# Patient Record
Sex: Male | Born: 1940 | Hispanic: Yes | Marital: Married | Smoking: Former smoker
Health system: Southern US, Community
[De-identification: ages and names within clinical notes are randomized; demographics above are authoritative.]

## PROBLEM LIST (undated history)

## (undated) DIAGNOSIS — I10 Essential (primary) hypertension: Secondary | ICD-10-CM

## (undated) DIAGNOSIS — E119 Type 2 diabetes mellitus without complications: Secondary | ICD-10-CM

## (undated) DIAGNOSIS — I2510 Atherosclerotic heart disease of native coronary artery without angina pectoris: Secondary | ICD-10-CM

## (undated) DIAGNOSIS — N189 Chronic kidney disease, unspecified: Secondary | ICD-10-CM

## (undated) DIAGNOSIS — M109 Gout, unspecified: Secondary | ICD-10-CM

## (undated) DIAGNOSIS — Z87442 Personal history of urinary calculi: Secondary | ICD-10-CM

## (undated) DIAGNOSIS — I509 Heart failure, unspecified: Secondary | ICD-10-CM

## (undated) DIAGNOSIS — I214 Non-ST elevation (NSTEMI) myocardial infarction: Secondary | ICD-10-CM

## (undated) DIAGNOSIS — C801 Malignant (primary) neoplasm, unspecified: Secondary | ICD-10-CM

## (undated) HISTORY — PX: CHOLECYSTECTOMY: SHX55

## (undated) HISTORY — PX: CATARACT EXTRACTION: SUR2

## (undated) HISTORY — PX: COLON SURGERY: SHX602

---

## 2015-08-03 DIAGNOSIS — I214 Non-ST elevation (NSTEMI) myocardial infarction: Secondary | ICD-10-CM

## 2015-08-03 DIAGNOSIS — I2510 Atherosclerotic heart disease of native coronary artery without angina pectoris: Secondary | ICD-10-CM

## 2015-08-03 HISTORY — DX: Atherosclerotic heart disease of native coronary artery without angina pectoris: I25.10

## 2015-08-03 HISTORY — DX: Non-ST elevation (NSTEMI) myocardial infarction: I21.4

## 2015-08-15 ENCOUNTER — Inpatient Hospital Stay (HOSPITAL_COMMUNITY)
Admission: EM | Admit: 2015-08-15 | Discharge: 2015-08-22 | DRG: 281 | Disposition: A | Discharge status: Home / Self Care | Payer: Medicare (Managed Care) | Attending: Internal Medicine | Admitting: Internal Medicine

## 2015-08-15 ENCOUNTER — Emergency Department (HOSPITAL_COMMUNITY): Payer: Medicare (Managed Care)

## 2015-08-15 ENCOUNTER — Encounter (HOSPITAL_COMMUNITY): Payer: Self-pay | Admitting: *Deleted

## 2015-08-15 DIAGNOSIS — I252 Old myocardial infarction: Secondary | ICD-10-CM

## 2015-08-15 DIAGNOSIS — N183 Chronic kidney disease, stage 3 unspecified: Secondary | ICD-10-CM | POA: Diagnosis present

## 2015-08-15 DIAGNOSIS — E1122 Type 2 diabetes mellitus with diabetic chronic kidney disease: Secondary | ICD-10-CM | POA: Diagnosis present

## 2015-08-15 DIAGNOSIS — J9801 Acute bronchospasm: Secondary | ICD-10-CM

## 2015-08-15 DIAGNOSIS — Z8546 Personal history of malignant neoplasm of prostate: Secondary | ICD-10-CM

## 2015-08-15 DIAGNOSIS — E785 Hyperlipidemia, unspecified: Secondary | ICD-10-CM | POA: Diagnosis present

## 2015-08-15 DIAGNOSIS — D649 Anemia, unspecified: Secondary | ICD-10-CM

## 2015-08-15 DIAGNOSIS — I5032 Chronic diastolic (congestive) heart failure: Secondary | ICD-10-CM | POA: Diagnosis present

## 2015-08-15 DIAGNOSIS — Z882 Allergy status to sulfonamides status: Secondary | ICD-10-CM

## 2015-08-15 DIAGNOSIS — E119 Type 2 diabetes mellitus without complications: Secondary | ICD-10-CM

## 2015-08-15 DIAGNOSIS — R0609 Other forms of dyspnea: Secondary | ICD-10-CM

## 2015-08-15 DIAGNOSIS — R0602 Shortness of breath: Secondary | ICD-10-CM | POA: Diagnosis not present

## 2015-08-15 DIAGNOSIS — J449 Chronic obstructive pulmonary disease, unspecified: Secondary | ICD-10-CM | POA: Diagnosis present

## 2015-08-15 DIAGNOSIS — R7989 Other specified abnormal findings of blood chemistry: Secondary | ICD-10-CM

## 2015-08-15 DIAGNOSIS — Z87891 Personal history of nicotine dependence: Secondary | ICD-10-CM

## 2015-08-15 DIAGNOSIS — R0603 Acute respiratory distress: Secondary | ICD-10-CM | POA: Diagnosis present

## 2015-08-15 DIAGNOSIS — I130 Hypertensive heart and chronic kidney disease with heart failure and stage 1 through stage 4 chronic kidney disease, or unspecified chronic kidney disease: Secondary | ICD-10-CM | POA: Diagnosis present

## 2015-08-15 DIAGNOSIS — I10 Essential (primary) hypertension: Secondary | ICD-10-CM | POA: Diagnosis present

## 2015-08-15 DIAGNOSIS — Z79899 Other long term (current) drug therapy: Secondary | ICD-10-CM

## 2015-08-15 DIAGNOSIS — M25512 Pain in left shoulder: Secondary | ICD-10-CM

## 2015-08-15 DIAGNOSIS — Z886 Allergy status to analgesic agent status: Secondary | ICD-10-CM

## 2015-08-15 DIAGNOSIS — Z794 Long term (current) use of insulin: Secondary | ICD-10-CM

## 2015-08-15 DIAGNOSIS — E11649 Type 2 diabetes mellitus with hypoglycemia without coma: Secondary | ICD-10-CM | POA: Diagnosis present

## 2015-08-15 DIAGNOSIS — I2510 Atherosclerotic heart disease of native coronary artery without angina pectoris: Secondary | ICD-10-CM

## 2015-08-15 DIAGNOSIS — D638 Anemia in other chronic diseases classified elsewhere: Secondary | ICD-10-CM | POA: Diagnosis present

## 2015-08-15 DIAGNOSIS — E1129 Type 2 diabetes mellitus with other diabetic kidney complication: Secondary | ICD-10-CM

## 2015-08-15 DIAGNOSIS — I214 Non-ST elevation (NSTEMI) myocardial infarction: Secondary | ICD-10-CM | POA: Diagnosis not present

## 2015-08-15 DIAGNOSIS — N189 Chronic kidney disease, unspecified: Secondary | ICD-10-CM | POA: Diagnosis not present

## 2015-08-15 DIAGNOSIS — Z7982 Long term (current) use of aspirin: Secondary | ICD-10-CM

## 2015-08-15 DIAGNOSIS — R778 Other specified abnormalities of plasma proteins: Secondary | ICD-10-CM | POA: Insufficient documentation

## 2015-08-15 DIAGNOSIS — I25118 Atherosclerotic heart disease of native coronary artery with other forms of angina pectoris: Secondary | ICD-10-CM | POA: Diagnosis present

## 2015-08-15 HISTORY — DX: Malignant (primary) neoplasm, unspecified: C80.1

## 2015-08-15 HISTORY — DX: Essential (primary) hypertension: I10

## 2015-08-15 HISTORY — DX: Type 2 diabetes mellitus without complications: E11.9

## 2015-08-15 LAB — CBC WITH DIFFERENTIAL/PLATELET
BASOS ABS: 0 10*3/uL (ref 0.0–0.1)
BASOS PCT: 1 %
Eosinophils Absolute: 0.7 10*3/uL (ref 0.0–0.7)
Eosinophils Relative: 11 %
HEMATOCRIT: 35.8 % — AB (ref 39.0–52.0)
HEMOGLOBIN: 11.9 g/dL — AB (ref 13.0–17.0)
LYMPHS PCT: 28 %
Lymphs Abs: 1.8 10*3/uL (ref 0.7–4.0)
MCH: 32.1 pg (ref 26.0–34.0)
MCHC: 33.2 g/dL (ref 30.0–36.0)
MCV: 96.5 fL (ref 78.0–100.0)
MONO ABS: 0.4 10*3/uL (ref 0.1–1.0)
Monocytes Relative: 6 %
NEUTROS ABS: 3.5 10*3/uL (ref 1.7–7.7)
NEUTROS PCT: 54 %
Platelets: 185 10*3/uL (ref 150–400)
RBC: 3.71 MIL/uL — AB (ref 4.22–5.81)
RDW: 14.7 % (ref 11.5–15.5)
WBC: 6.4 10*3/uL (ref 4.0–10.5)

## 2015-08-15 LAB — BASIC METABOLIC PANEL
ANION GAP: 7 (ref 5–15)
BUN: 40 mg/dL — ABNORMAL HIGH (ref 6–20)
CALCIUM: 9.2 mg/dL (ref 8.9–10.3)
CO2: 26 mmol/L (ref 22–32)
Chloride: 108 mmol/L (ref 101–111)
Creatinine, Ser: 2.49 mg/dL — ABNORMAL HIGH (ref 0.61–1.24)
GFR calc non Af Amer: 24 mL/min — ABNORMAL LOW (ref >60)
GFR, EST AFRICAN AMERICAN: 28 mL/min — AB (ref >60)
GLUCOSE: 109 mg/dL — AB (ref 65–99)
POTASSIUM: 4.6 mmol/L (ref 3.5–5.1)
Sodium: 141 mmol/L (ref 135–145)

## 2015-08-15 LAB — I-STAT TROPONIN, ED: Troponin i, poc: 0.5 ng/mL (ref 0.00–0.08)

## 2015-08-15 LAB — VITAMIN B12: Vitamin B-12: 582 pg/mL (ref 180–914)

## 2015-08-15 LAB — IRON AND TIBC
Iron: 53 ug/dL (ref 45–182)
SATURATION RATIOS: 15 % — AB (ref 17.9–39.5)
TIBC: 353 ug/dL (ref 250–450)
UIBC: 300 ug/dL

## 2015-08-15 LAB — PROTIME-INR
INR: 1 (ref 0.00–1.49)
Prothrombin Time: 13.4 seconds (ref 11.6–15.2)

## 2015-08-15 LAB — D-DIMER, QUANTITATIVE (NOT AT ARMC): D DIMER QUANT: 0.89 ug{FEU}/mL — AB (ref 0.00–0.50)

## 2015-08-15 LAB — BRAIN NATRIURETIC PEPTIDE: B Natriuretic Peptide: 112.7 pg/mL — ABNORMAL HIGH (ref 0.0–100.0)

## 2015-08-15 LAB — TROPONIN I: Troponin I: 0.41 ng/mL — ABNORMAL HIGH (ref <0.031)

## 2015-08-15 LAB — GLUCOSE, CAPILLARY: GLUCOSE-CAPILLARY: 177 mg/dL — AB (ref 65–99)

## 2015-08-15 LAB — APTT: APTT: 38 s — AB (ref 24–37)

## 2015-08-15 LAB — FERRITIN: FERRITIN: 99 ng/mL (ref 24–336)

## 2015-08-15 MED ORDER — ALPRAZOLAM 0.5 MG PO TABS
0.5000 mg | ORAL_TABLET | Freq: Every day | ORAL | Status: DC
  Administered 2015-08-15 – 2015-08-21 (×7): 0.5 mg via ORAL
  Filled 2015-08-15 (×7): qty 1

## 2015-08-15 MED ORDER — ACETAMINOPHEN 500 MG PO TABS
500.0000 mg | ORAL_TABLET | Freq: Every day | ORAL | Status: DC | PRN
  Administered 2015-08-16 – 2015-08-17 (×2): 500 mg via ORAL
  Filled 2015-08-15 (×2): qty 1

## 2015-08-15 MED ORDER — ASPIRIN 81 MG PO CHEW
324.0000 mg | CHEWABLE_TABLET | Freq: Once | ORAL | Status: AC
  Administered 2015-08-15: 324 mg via ORAL
  Filled 2015-08-15: qty 4

## 2015-08-15 MED ORDER — HEPARIN (PORCINE) IN NACL 100-0.45 UNIT/ML-% IJ SOLN
1100.0000 [IU]/h | INTRAMUSCULAR | Status: DC
  Administered 2015-08-15: 1000 [IU]/h via INTRAVENOUS
  Administered 2015-08-16: 1100 [IU]/h via INTRAVENOUS
  Filled 2015-08-15 (×2): qty 250

## 2015-08-15 MED ORDER — IPRATROPIUM-ALBUTEROL 0.5-2.5 (3) MG/3ML IN SOLN
3.0000 mL | RESPIRATORY_TRACT | Status: DC | PRN
  Administered 2015-08-15: 3 mL via RESPIRATORY_TRACT
  Filled 2015-08-15 (×3): qty 3

## 2015-08-15 MED ORDER — INSULIN ASPART 100 UNIT/ML ~~LOC~~ SOLN
0.0000 [IU] | Freq: Three times a day (TID) | SUBCUTANEOUS | Status: DC
  Administered 2015-08-16: 2 [IU] via SUBCUTANEOUS
  Administered 2015-08-16 – 2015-08-17 (×2): 3 [IU] via SUBCUTANEOUS
  Administered 2015-08-18: 2 [IU] via SUBCUTANEOUS
  Administered 2015-08-18: 3 [IU] via SUBCUTANEOUS
  Administered 2015-08-19 – 2015-08-22 (×6): 2 [IU] via SUBCUTANEOUS

## 2015-08-15 MED ORDER — TECHNETIUM TO 99M ALBUMIN AGGREGATED
4.4000 | Freq: Once | INTRAVENOUS | Status: AC | PRN
  Administered 2015-08-15: 4.4 via INTRAVENOUS

## 2015-08-15 MED ORDER — ISOSORBIDE MONONITRATE ER 60 MG PO TB24
60.0000 mg | ORAL_TABLET | Freq: Every day | ORAL | Status: DC
  Administered 2015-08-16: 60 mg via ORAL
  Filled 2015-08-15 (×2): qty 1

## 2015-08-15 MED ORDER — NITROGLYCERIN 0.4 MG SL SUBL: 0.4000 mg | SUBLINGUAL_TABLET | SUBLINGUAL | Status: DC | PRN

## 2015-08-15 MED ORDER — TECHNETIUM TC 99M DIETHYLENETRIAME-PENTAACETIC ACID: 31.8000 | Freq: Once | INTRAVENOUS | Status: DC | PRN

## 2015-08-15 MED ORDER — METOPROLOL TARTRATE 50 MG PO TABS
50.0000 mg | ORAL_TABLET | Freq: Two times a day (BID) | ORAL | Status: DC
  Administered 2015-08-15 – 2015-08-22 (×14): 50 mg via ORAL
  Filled 2015-08-15 (×14): qty 1

## 2015-08-15 MED ORDER — ALPRAZOLAM 0.25 MG PO TABS: 0.2500 mg | ORAL_TABLET | Freq: Two times a day (BID) | ORAL | Status: DC | PRN

## 2015-08-15 MED ORDER — ONDANSETRON HCL 4 MG/2ML IJ SOLN: 4.0000 mg | Freq: Four times a day (QID) | INTRAMUSCULAR | Status: DC | PRN

## 2015-08-15 MED ORDER — ATORVASTATIN CALCIUM 40 MG PO TABS
40.0000 mg | ORAL_TABLET | Freq: Every day | ORAL | Status: DC
  Administered 2015-08-15: 40 mg via ORAL
  Filled 2015-08-15: qty 1

## 2015-08-15 MED ORDER — OMEPRAZOLE MAGNESIUM 20 MG PO TBEC: 40.0000 mg | DELAYED_RELEASE_TABLET | Freq: Every day | ORAL | Status: DC

## 2015-08-15 MED ORDER — HEPARIN BOLUS VIA INFUSION
4000.0000 [IU] | Freq: Once | INTRAVENOUS | Status: DC
  Filled 2015-08-15: qty 4000

## 2015-08-15 MED ORDER — PANTOPRAZOLE SODIUM 40 MG PO TBEC
80.0000 mg | DELAYED_RELEASE_TABLET | Freq: Every day | ORAL | Status: DC
  Administered 2015-08-15 – 2015-08-22 (×8): 80 mg via ORAL
  Filled 2015-08-15 (×8): qty 2

## 2015-08-15 MED ORDER — GABAPENTIN 300 MG PO CAPS
600.0000 mg | ORAL_CAPSULE | Freq: Every day | ORAL | Status: DC
  Administered 2015-08-15 – 2015-08-16 (×2): 600 mg via ORAL
  Filled 2015-08-15 (×4): qty 2

## 2015-08-15 MED ORDER — INSULIN GLARGINE 100 UNIT/ML ~~LOC~~ SOLN
35.0000 [IU] | Freq: Every day | SUBCUTANEOUS | Status: DC
  Filled 2015-08-15: qty 0.35

## 2015-08-15 MED ORDER — IPRATROPIUM-ALBUTEROL 0.5-2.5 (3) MG/3ML IN SOLN
3.0000 mL | Freq: Once | RESPIRATORY_TRACT | Status: AC
  Administered 2015-08-15: 3 mL via RESPIRATORY_TRACT
  Filled 2015-08-15: qty 3

## 2015-08-15 MED ORDER — ASPIRIN EC 81 MG PO TBEC
81.0000 mg | DELAYED_RELEASE_TABLET | Freq: Every day | ORAL | Status: DC
  Administered 2015-08-16 – 2015-08-22 (×7): 81 mg via ORAL
  Filled 2015-08-15 (×7): qty 1

## 2015-08-15 MED ORDER — ALBUTEROL SULFATE (2.5 MG/3ML) 0.083% IN NEBU
INHALATION_SOLUTION | RESPIRATORY_TRACT | Status: AC
  Filled 2015-08-15: qty 3

## 2015-08-15 NOTE — ED Provider Notes (Signed)
5:10 PM Assumed care from Dr. Oleta Pacheco, please see their note for full history, physical and decision making until this point. In brief this is a 75 y.o. year old male who presented to the ED tonight with Cough and Wheezing     75 yo M w/ DM, HTN, heart disease presents for cough, wheezing, fatigue, left shoulder pain for a week after a trip form [place] in February. Positive troponin and d dimer, slightly elevated BNP. Also with CKD, so is getting VQ scan, needs rule out or treatment afterwards.   As top two items on differential are NSTEMI v PE (has inferolaeral ST depressions on ECG, I will start heparin. Plan for admission after vq scan.   VQ scan negative. Admitted to hospitalist.   Labs, studies and imaging reviewed by myself and considered in medical decision making if ordered. Imaging interpreted by radiology.  Labs Reviewed  CBC WITH DIFFERENTIAL/PLATELET - Abnormal; Notable for the following:    RBC 3.71 (*)    Hemoglobin 11.9 (*)    HCT 35.8 (*)    All other components within normal limits  BASIC METABOLIC PANEL - Abnormal; Notable for the following:    Glucose, Bld 109 (*)    BUN 40 (*)    Creatinine, Ser 2.49 (*)    GFR calc non Af Amer 24 (*)    GFR calc Af Amer 28 (*)    All other components within normal limits  D-DIMER, QUANTITATIVE (NOT AT [facility]) - Abnormal; Notable for the following:    D-Dimer, Quant 0.89 (*)    All other components within normal limits  BRAIN NATRIURETIC PEPTIDE - Abnormal; Notable for the following:    B Natriuretic Peptide 112.7 (*)    All other components within normal limits  I-STAT TROPOININ, ED - Abnormal; Notable for the following:    Troponin i, poc 0.50 (*)    All other components within normal limits    DG Chest 2 View  Final Result    NM Pulmonary Perf and Vent    (Results Pending)    No Follow-up on file.   Daniel Pew, MD 08/16/15 [phone]

## 2015-08-15 NOTE — Progress Notes (Signed)
ANTICOAGULATION CONSULT NOTE - Initial Consult  Pharmacy Consult for heparin infusion Indication: chest pain/ACS  Allergies  Allergen Reactions  . Nsaids     Pt has kidney problems, wants to avoid all nsaids  . Sulfa Antibiotics Rash    Patient Measurements: Height: 5\' 5"  (165.1 cm) Weight: 169 lb (76.658 kg) IBW/kg (Calculated) : 61.5 Heparin Dosing Weight:   Vital Signs: Temp: 98.2 F (36.8 C) (04/13 1124) Temp Source: Oral (04/13 1124) BP: 134/81 mmHg (04/13 1124) Pulse Rate: 83 (04/13 1124)  Labs:  Recent Labs  08/15/15 1353  HGB 11.9*  HCT 35.8*  PLT 185  CREATININE 2.49*    Estimated Creatinine Clearance: 24.9 mL/min (by C-G formula based on Cr of 2.49).   Medical History: Past Medical History  Diagnosis Date  . Hypertension   . Diabetes mellitus without complication ([place])   . Cancer ([place])     Medications:  Infusions:  . heparin      Assessment: Pharmacy is consulted to dose heparin in 29 male diagnosed with ACS. Per MD notes, differential is NSTEMI vs PE. Pt also has positive troponin and d-dimer is elevated.    08/15/2015   Baseline PT/INR, ptt ordered  No anticoagulants noted on pt PTA meds    Goal of Therapy:  Heparin level 0.3-0.7 units/ml Monitor platelets by anticoagulation protocol: Yes   Plan:  Give 4000 units bolus x 1 Start heparin infusion at 1000 units/hr  Heparin level 8 hours after start due at  0300 Daily CBC, heparin level    Royetta Asal, PharmD, BCPS Pager [phone] 08/15/2015 6:06 PM

## 2015-08-15 NOTE — H&P (Signed)
[facility] History and Physical  Aashay Savignano [id number] DOB: 05/20/1940 DOA: 08/15/2015  Referring physician: ED physician PCP: No primary care provider on file.  Specialists: Cardiologist in [place]  Chief Complaint:  Dyspnea, wheezing   HPI: Daniel Pacheco is a 75 y.o. male with PMH of prostate cancer in remission, insulin-dependent diabetes mellitus, hypertension, chronic kidney disease, and acute MI 5 years ago (medically managed) who now presents to the ED with 1 week of dyspnea, cough, wheeze, and fatigue. Patient resides in [place] and has been visiting his local daughter since February 2017. He had been in his usual state until approximately one week ago when he noticed insidious onset of dyspnea with nonproductive cough and wheezing. Patient denies experiencing similar symptoms previously. He is a former smoker, quitting 35 years ago per his report. He denies chest pains, palpitations, nausea, or diaphoresis. Patient does endorse left-sided shoulder pain, but states that this has been a chronic problem. He had similar pain in his right shoulder that was attributed to arthritis and he underwent surgery for this. Dyspnea is worse with exertion but also present at rest. Patient used his daughter's albuterol nebulizer 2 today, but with no appreciable relief in symptoms. He contacted his cardiologist in [place] who advised seeking evaluation in the emergency department.  In ED, patient was found to be afebrile, saturating well on room air, and with vital signs stable. EKG demonstrates a sinus arrhythmia with T-wave inversions in the inferolateral leads. Initial troponin is elevated to 0.50. D-dimer was obtained and also elevated to a value of 0.89. This was followed by VQ scan which was interpreted as a low probability study for PE. BMP is notable for serum creatinine of 2.49 with unknown baseline. CBC features a hemoglobin of 11.9, again with unknown baseline. Chest  x-ray was obtained and notable for arthritic changes in the bilateral shoulders, but no acute cardiopulmonary disease. Patient was given a 324 mg aspirin and DuoNeb treatment. He reported significant relief in his respiratory symptoms with the DuoNeb. Given the EKG changes and elevated troponin, EDP consulted pharmacy for initiation of heparin infusion per ACS dosing. Patient remained hemodynamically stable and chest pain-free in the emergency department and will be admitted to the hospital for ongoing evaluation and management of dyspnea with wheezing and concern for possible ACS.   Where does patient live?   At home   Can patient participate in ADLs?  Yes     Review of Systems:   General: no fevers, chills, sweats, weight change, or poor appetite. Fatigue HEENT: no blurry vision, hearing changes or sore throat Pulm:  Dyspnea, non-productive cough, wheezing  CV: no chest pain or palpitations Abd: no nausea, vomiting, abdominal pain, diarrhea, or constipation GU: no dysuria, hematuria, increased urinary frequency, or urgency  Ext: no leg edema Neuro: no focal weakness, numbness, or tingling, no vision change or hearing loss Skin: no rash, no wounds MSK: No muscle spasm, no deformity, no red, hot, or swollen joint. Chronic left shoulder pain.  Heme: No easy bruising or bleeding Travel history:  Arrived from [place] in February 2017.    Allergy:  Allergies  Allergen Reactions  . Nsaids     Pt has kidney problems, wants to avoid all nsaids  . Sulfa Antibiotics Rash    Past Medical History  Diagnosis Date  . Hypertension   . Diabetes mellitus without complication ([place])   . Cancer [facility])     History reviewed. No pertinent past surgical history.  Social History:  reports that he has quit smoking. He does not have any smokeless tobacco history on file. He reports that he does not drink alcohol. His drug history is not on file.  Family History: History reviewed. No pertinent family  history.   Prior to Admission medications   Medication Sig Start Date End Date Taking? Authorizing Provider  acetaminophen (TYLENOL) 500 MG tablet Take 500 mg by mouth daily as needed for moderate pain.   Yes Historical Provider, MD  ALPRAZolam Duanne Moron) 1 MG tablet Take 0.5 mg by mouth at bedtime.   Yes Historical Provider, MD  aspirin EC 81 MG tablet Take 81 mg by mouth daily.   Yes Historical Provider, MD  gabapentin (NEURONTIN) 600 MG tablet Take 600 mg by mouth at bedtime.   Yes Historical Provider, MD  insulin glargine (LANTUS) 100 UNIT/ML injection Inject 20-45 Units into the skin at bedtime. Inject 45 units in the morning and in the evening take anywhere from 20 to 35 units depending on blood glucose levels   Yes Historical Provider, MD  isosorbide mononitrate (IMDUR) 60 MG 24 hr tablet Take 60 mg by mouth daily.   Yes Historical Provider, MD  metoprolol (LOPRESSOR) 50 MG tablet Take 50 mg by mouth 2 (two) times daily.   Yes Historical Provider, MD  omeprazole (PRILOSEC OTC) 20 MG tablet Take 20 mg by mouth daily as needed (acid reflux).   Yes Historical Provider, MD    Physical Exam: Filed Vitals:   08/15/15 1124 08/15/15 1740  BP: 134/81   Pulse: 83   Temp: 98.2 F (36.8 C)   TempSrc: Oral   Resp: 18   Height:  5\' 5"  (1.651 m)  Weight:  76.658 kg (169 lb)  SpO2: 98%    General: Not in acute distress HEENT:       Eyes: PERRL, EOMI, no scleral icterus or conjunctival pallor.       ENT: No discharge from the ears or nose, no pharyngeal ulcers        Neck: No JVD, no bruit, no appreciable mass Heme: No cervical adenopathy, no pallor Cardiac: S1/S2, RRR, No murmurs, No gallops or rubs. Pulm: Good air movement bilaterally. Expiratory wheezes b/l, right > left.  Abd: Soft, nondistended, nontender, no rebound pain or gaurding, no mass or organomegaly, BS present. Ext: No LE edema bilaterally. 2+DP/PT pulse bilaterally. Musculoskeletal: No gross deformity, no red, hot, swollen  joints  Skin: No rashes or wounds on exposed surfaces  Neuro: Alert, oriented X3, cranial nerves II-XII grossly intact. No focal findings Psych: Patient is not overtly psychotic, appropriate mood and affect.  Labs on Admission:  Basic Metabolic Panel:  Recent Labs Lab 08/15/15 1353  NA 141  K 4.6  CL 108  CO2 26  GLUCOSE 109*  BUN 40*  CREATININE 2.49*  CALCIUM 9.2   Liver Function Tests: No results for input(s): AST, ALT, ALKPHOS, BILITOT, PROT, ALBUMIN in the last 168 hours. No results for input(s): LIPASE, AMYLASE in the last 168 hours. No results for input(s): AMMONIA in the last 168 hours. CBC:  Recent Labs Lab 08/15/15 1353  WBC 6.4  NEUTROABS 3.5  HGB 11.9*  HCT 35.8*  MCV 96.5  PLT 185   Cardiac Enzymes: No results for input(s): CKTOTAL, CKMB, CKMBINDEX, TROPONINI in the last 168 hours.  BNP (last 3 results)  Recent Labs  08/15/15 1355  BNP 112.7*    ProBNP (last 3 results) No results for input(s): PROBNP in the last 8760  hours.  CBG: No results for input(s): GLUCAP in the last 168 hours.  Radiological Exams on Admission: Dg Chest 2 View  08/15/2015  CLINICAL DATA:  Shortness of breath with cough and mid chest pain 1 week. History of prostate cancer. EXAM: CHEST  2 VIEW COMPARISON:  None. FINDINGS: Lungs are somewhat hypoinflated without focal consolidation or effusion. Cardiomediastinal silhouette is within normal. There is calcified plaque over the aortic arch. Mild degenerate change of the spine and shoulders. IMPRESSION: No active cardiopulmonary disease. Electronically Signed   By: Marin Olp M.D.   On: 08/15/2015 14:22   Nm Pulmonary Perf And Vent  08/15/2015  CLINICAL DATA:  Shortness of breath and cough for several days EXAM: NUCLEAR MEDICINE VENTILATION - PERFUSION LUNG SCAN Views: Anterior, posterior, left lateral, right lateral, RPO, LPO, RAO, LAO -ventilation and perfusion RADIOPHARMACEUTICALS:  31.8 mCi Technetium-70m DTPA aerosol  inhalation and 4.4 mCi Technetium-47m MAA IV COMPARISON:  Chest radiograph August 15, 2015 FINDINGS: Ventilation: There are patchy areas of decreased ventilation, primarily in the upper lobes, in a nonsegmental distribution. No well-defined segmental ventilation defects evident. Perfusion: There are scattered nonsegmental perfusion defects which essentially match the ventilation defects. There is no appreciable ventilation/perfusion mismatch. IMPRESSION: There are scattered matching nonsegmental ventilation and perfusion defects. There is no appreciable ventilation/ perfusion mismatch. These findings constitute an overall low probability of pulmonary embolus. Electronically Signed   By: Lowella Grip III M.D.   On: 08/15/2015 17:26    EKG: Independently reviewed.  Abnormal findings:  Sinus arrhythmia, T-wave inversions in inferolateral leads, early R-wave progression   Assessment/Plan  1. 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 Dyspnea, bronchospasm  - COPD possible given remote smoking hx   - There is FHx of asthma; pt reports no improvement with daughter's albuterol neb x2 prior to arrival, but reports significant improvement with DuoNeb here  - No features to suggest infection, though an allergic component is possible  - Cardiac etiology is being considered as above  - Continue DuoNeb prn SOB or wheezing  - Saturating well on rm air  3. Left  shoulder pain  - Chronic and unchanged, attributed to OA which is supported by CXR findings  - Pain-control prn    4. CKD, unknown baseline - SCr 2.49 on admission with BUN 40 - Pt endorses hx of CKD, but does not know prior SCr  - Pt reports CKD attributed to excessive NSAID use for OA in the past  - May be an acute component in setting of the current illness - Repeat BMP in am    5. Type II DM  - Uses Lantus 45 units qAM, and a sliding-scale dose of Lantus in evening based on CBG - A1c unknown  - Continue with Lantus at reduced-dose, 35 units qAM, plus moderate-intensity SSI correctional, adjust prn  - Check CBG with meals and qHS  - Carb-modified  diet when appropriate  - Check A1c   6. Hypertension  - At goal currently  - Managed with Lopressor at home  - Continue home-dose Lopressor 25 mg BID    7. Anemia  - Hgb 11.9 on admission with unknown baseline  - No sign of active blood loss  - Check iron studies, B12, folate   8. Insomnia  - Managed effectively with 0.5 mg Xanax qHS at home, will continue     DVT ppx: Heparin infusion per ACS dosing  Code Status: Full code Family Communication:  Yes, patient's wife and daughter at bed side Disposition Plan: Admit to inpatient   Date of Service 08/15/2015    Vianne Bulls, MD [facility] Pager [phone]  If 7PM-7AM, please contact night-coverage [email or web address] Password [email or web address] 08/15/2015, 7:10 PM

## 2015-08-15 NOTE — ED Provider Notes (Signed)
CSN: [id number]     Arrival date & time 08/15/15  1059 History   First MD Initiated Contact with Patient 08/15/15 1312     Chief Complaint  Patient presents with  . Cough  . Wheezing     (Consider location/radiation/quality/duration/timing/severity/associated sxs/prior Treatment) HPI  75 year old male who presents with cough and wheezing. History of diabetes, CAD, and hypertension. Recently traveled to [place] for [place] in February to visit his daughter. Over the past week has developed a productive cough of dark sputum, occasionally blood tinged. Has had increasing shortness of breath/dyspnea on exertion with wheezing. Has taken his daughter's albuterol with some improvement. Denies fevers, chills, congestion. Has had mild sore throat. No known sick contacts. No lower extremity or edema orthopnea or PND.  Past Medical History  Diagnosis Date  . Hypertension   . Diabetes mellitus without complication ([place])   . Cancer [facility])    History reviewed. No pertinent past surgical history. History reviewed. No pertinent family history. Social History  Substance Use Topics  . Smoking status: Former Research scientist (life sciences)  . Smokeless tobacco: None  . Alcohol Use: No    Review of Systems 10/14 systems reviewed and are negative other than those stated in the HPI    Allergies  Nsaids and Sulfa antibiotics  Home Medications   Prior to Admission medications   Medication Sig Start Date End Date Taking? Authorizing Provider  acetaminophen (TYLENOL) 500 MG tablet Take 500 mg by mouth daily as needed for moderate pain.   Yes Historical Provider, MD  ALPRAZolam Duanne Moron) 1 MG tablet Take 0.5 mg by mouth at bedtime.   Yes Historical Provider, MD  aspirin EC 81 MG tablet Take 81 mg by mouth daily.   Yes Historical Provider, MD  gabapentin (NEURONTIN) 600 MG tablet Take 600 mg by mouth at bedtime.   Yes Historical Provider, MD  insulin glargine (LANTUS) 100 UNIT/ML injection Inject 20-45 Units into the  skin at bedtime. Inject 45 units in the morning and in the evening take anywhere from 20 to 35 units depending on blood glucose levels   Yes Historical Provider, MD  isosorbide mononitrate (IMDUR) 60 MG 24 hr tablet Take 60 mg by mouth daily.   Yes Historical Provider, MD  metoprolol (LOPRESSOR) 50 MG tablet Take 50 mg by mouth 2 (two) times daily.   Yes Historical Provider, MD  omeprazole (PRILOSEC OTC) 20 MG tablet Take 20 mg by mouth daily as needed (acid reflux).   Yes Historical Provider, MD   BP 141/85 mmHg  Pulse 84  Temp(Src) 98.3 F (36.8 C) (Oral)  Resp 18  Ht 5\' 5"  (1.651 m)  Wt 171 lb 8 oz (77.792 kg)  BMI 28.54 kg/m2  SpO2 100% Physical Exam Physical Exam  Nursing note and vitals reviewed. Constitutional: Well developed, well nourished, non-toxic, and in no acute distress Head: Normocephalic and atraumatic.  Mouth/Throat: Oropharynx is clear and moist.  Neck: Normal range of motion. Neck supple.  Cardiovascular: Normal rate and regular rhythm.   Pulmonary/Chest: Effort normal. Diffuse expiratory wheezing. Abdominal: Soft. There is no tenderness. There is no rebound and no guarding.  Musculoskeletal: Normal range of motion.  no lower extremity edema  Neurological: Alert, no facial droop, fluent speech, moves all extremities symmetrically Skin: Skin is warm and dry.  Psychiatric: Cooperative  ED Course  Procedures (including critical care time) Labs Review Labs Reviewed  CBC WITH DIFFERENTIAL/PLATELET - Abnormal; Notable for the following:    RBC 3.71 (*)    Hemoglobin  11.9 (*)    HCT 35.8 (*)    All other components within normal limits  BASIC METABOLIC PANEL - Abnormal; Notable for the following:    Glucose, Bld 109 (*)    BUN 40 (*)    Creatinine, Ser 2.49 (*)    GFR calc non Af Amer 24 (*)    GFR calc Af Amer 28 (*)    All other components within normal limits  D-DIMER, QUANTITATIVE (NOT AT [facility]) - Abnormal; Notable for the following:    D-Dimer, Quant  0.89 (*)    All other components within normal limits  BRAIN NATRIURETIC PEPTIDE - Abnormal; Notable for the following:    B Natriuretic Peptide 112.7 (*)    All other components within normal limits  APTT - Abnormal; Notable for the following:    aPTT 38 (*)    All other components within normal limits  HEPARIN LEVEL (UNFRACTIONATED) - Abnormal; Notable for the following:    Heparin Unfractionated 0.21 (*)    All other components within normal limits  CBC - Abnormal; Notable for the following:    RBC 3.67 (*)    Hemoglobin 11.9 (*)    HCT 35.5 (*)    All other components within normal limits  BASIC METABOLIC PANEL - Abnormal; Notable for the following:    Sodium 146 (*)    BUN 38 (*)    Creatinine, Ser 2.37 (*)    GFR calc non Af Amer 25 (*)    GFR calc Af Amer 29 (*)    All other components within normal limits  TROPONIN I - Abnormal; Notable for the following:    Troponin I 0.41 (*)    All other components within normal limits  TROPONIN I - Abnormal; Notable for the following:    Troponin I 0.32 (*)    All other components within normal limits  TROPONIN I - Abnormal; Notable for the following:    Troponin I 0.34 (*)    All other components within normal limits  HEMOGLOBIN A1C - Abnormal; Notable for the following:    Hgb A1c MFr Bld 6.9 (*)    All other components within normal limits  IRON AND TIBC - Abnormal; Notable for the following:    Saturation Ratios 15 (*)    All other components within normal limits  FOLATE RBC - Abnormal; Notable for the following:    Hematocrit 34.3 (*)    All other components within normal limits  GLUCOSE, CAPILLARY - Abnormal; Notable for the following:    Glucose-Capillary 177 (*)    All other components within normal limits  GLUCOSE, CAPILLARY - Abnormal; Notable for the following:    Glucose-Capillary 59 (*)    All other components within normal limits  GLUCOSE, CAPILLARY - Abnormal; Notable for the following:    Glucose-Capillary  182 (*)    All other components within normal limits  CBC - Abnormal; Notable for the following:    RBC 3.26 (*)    Hemoglobin 10.6 (*)    HCT 31.7 (*)    All other components within normal limits  BASIC METABOLIC PANEL - Abnormal; Notable for the following:    Glucose, Bld 107 (*)    BUN 37 (*)    Creatinine, Ser 2.77 (*)    GFR calc non Af Amer 21 (*)    GFR calc Af Amer 24 (*)    All other components within normal limits  HEPARIN LEVEL (UNFRACTIONATED) - Abnormal; Notable for the following:  Heparin Unfractionated 1.06 (*)    All other components within normal limits  GLUCOSE, CAPILLARY - Abnormal; Notable for the following:    Glucose-Capillary 143 (*)    All other components within normal limits  GLUCOSE, CAPILLARY - Abnormal; Notable for the following:    Glucose-Capillary 188 (*)    All other components within normal limits  I-STAT TROPOININ, ED - Abnormal; Notable for the following:    Troponin i, poc 0.50 (*)    All other components within normal limits  PROTIME-INR  FERRITIN  VITAMIN B12  HEPARIN LEVEL (UNFRACTIONATED)  GLUCOSE, CAPILLARY  GLUCOSE, CAPILLARY  MAGNESIUM  GLUCOSE, CAPILLARY  HEPARIN LEVEL (UNFRACTIONATED)  HEPARIN LEVEL (UNFRACTIONATED)    Imaging Review Dg Chest 2 View  08/15/2015  CLINICAL DATA:  Shortness of breath with cough and mid chest pain 1 week. History of prostate cancer. EXAM: CHEST  2 VIEW COMPARISON:  None. FINDINGS: Lungs are somewhat hypoinflated without focal consolidation or effusion. Cardiomediastinal silhouette is within normal. There is calcified plaque over the aortic arch. Mild degenerate change of the spine and shoulders. IMPRESSION: No active cardiopulmonary disease. Electronically Signed   By: Marin Olp M.D.   On: 08/15/2015 14:22   Nm Pulmonary Perf And Vent  08/15/2015  CLINICAL DATA:  Shortness of breath and cough for several days EXAM: NUCLEAR MEDICINE VENTILATION - PERFUSION LUNG SCAN Views: Anterior, posterior,  left lateral, right lateral, RPO, LPO, RAO, LAO -ventilation and perfusion RADIOPHARMACEUTICALS:  31.8 mCi Technetium-48m DTPA aerosol inhalation and 4.4 mCi Technetium-7m MAA IV COMPARISON:  Chest radiograph August 15, 2015 FINDINGS: Ventilation: There are patchy areas of decreased ventilation, primarily in the upper lobes, in a nonsegmental distribution. No well-defined segmental ventilation defects evident. Perfusion: There are scattered nonsegmental perfusion defects which essentially match the ventilation defects. There is no appreciable ventilation/perfusion mismatch. IMPRESSION: There are scattered matching nonsegmental ventilation and perfusion defects. There is no appreciable ventilation/ perfusion mismatch. These findings constitute an overall low probability of pulmonary embolus. Electronically Signed   By: Lowella Grip III M.D.   On: 08/15/2015 17:26   I have personally reviewed and evaluated these images and lab results as part of my medical decision-making.   EKG Interpretation   Date/Time:  Thursday August 15 2015 13:45:20 EDT Ventricular Rate:  72 PR Interval:  132 QRS Duration: 93 QT Interval:  370 QTC Calculation: 405 R Axis:   48 Text Interpretation:  Sinus arrhythmia Abnormal R-wave progression, early  transition Repol abnrm suggests ischemia, lateral leads inferiolateral ST  depressions without prior EKG for comparison  Confirmed by Aquanetta Schwarz MD, Grabiel Schmutz  [id number]) on 08/15/2015 4:16:00 PM      MDM   Final diagnoses:  SOB (shortness of breath)    75 year old male who presents with coughing, wheezing, and shortness of breath over 1 week. He is nontoxic in no acute distress. With vital signs within normal limits. Does not appear fluid overloaded. With diffuse expiratory wheezing on exam. No calf tenderness. Chest x-ray shows no acute cardiopulmonary processes. EKG does show inferolateral ST depressions, and he does not have a prior EKG for comparison. States that he had a mild  heart attack several years ago in [place]. He does have elevated troponin of 0.50 and a BNP of 112. With his recent travel, concern for possible PE. D-dimer is elevated and he will undergo VQ scan given his elevated creatinine (he reports history of CKD). Signed out to oncoming physician Dr Dayna Barker pending scan. Will plan to admit for possible  ACS vs Pe.    Forde Dandy, MD 08/17/15 1324

## 2015-08-15 NOTE — ED Notes (Signed)
Troponin .50. DR Oleta Mouse Aware

## 2015-08-15 NOTE — ED Notes (Signed)
Pt c/o left shoulder pain.  Pt stated "I had my right shoulder fixed.  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

## 2015-08-15 NOTE — ED Notes (Signed)
Pt off floor for scan.  Ramond Marrow, RN with pt.

## 2015-08-15 NOTE — ED Notes (Addendum)
Pt complains of cough, wheezing and fatigue for the past week. Pt states he tried using his daughter's albuterol nebulizer 2 days ago without relief. Pt states he has pain in his face and throat that is worse with coughing and breathing. Pt denies chest pain. Pt is here from [place] for vacation, arrived in February.

## 2015-08-16 ENCOUNTER — Observation Stay (HOSPITAL_BASED_OUTPATIENT_CLINIC_OR_DEPARTMENT_OTHER): Payer: Medicare (Managed Care)

## 2015-08-16 DIAGNOSIS — J449 Chronic obstructive pulmonary disease, unspecified: Secondary | ICD-10-CM | POA: Diagnosis present

## 2015-08-16 DIAGNOSIS — Z8546 Personal history of malignant neoplasm of prostate: Secondary | ICD-10-CM | POA: Diagnosis not present

## 2015-08-16 DIAGNOSIS — N189 Chronic kidney disease, unspecified: Secondary | ICD-10-CM | POA: Diagnosis not present

## 2015-08-16 DIAGNOSIS — N183 Chronic kidney disease, stage 3 (moderate): Secondary | ICD-10-CM | POA: Diagnosis present

## 2015-08-16 DIAGNOSIS — N184 Chronic kidney disease, stage 4 (severe): Secondary | ICD-10-CM | POA: Diagnosis not present

## 2015-08-16 DIAGNOSIS — I214 Non-ST elevation (NSTEMI) myocardial infarction: Secondary | ICD-10-CM | POA: Diagnosis present

## 2015-08-16 DIAGNOSIS — Z79899 Other long term (current) drug therapy: Secondary | ICD-10-CM | POA: Diagnosis not present

## 2015-08-16 DIAGNOSIS — M25512 Pain in left shoulder: Secondary | ICD-10-CM | POA: Diagnosis present

## 2015-08-16 DIAGNOSIS — J9801 Acute bronchospasm: Secondary | ICD-10-CM | POA: Diagnosis present

## 2015-08-16 DIAGNOSIS — E119 Type 2 diabetes mellitus without complications: Secondary | ICD-10-CM | POA: Diagnosis not present

## 2015-08-16 DIAGNOSIS — Z886 Allergy status to analgesic agent status: Secondary | ICD-10-CM | POA: Diagnosis not present

## 2015-08-16 DIAGNOSIS — E785 Hyperlipidemia, unspecified: Secondary | ICD-10-CM | POA: Diagnosis present

## 2015-08-16 DIAGNOSIS — I10 Essential (primary) hypertension: Secondary | ICD-10-CM | POA: Diagnosis not present

## 2015-08-16 DIAGNOSIS — E11649 Type 2 diabetes mellitus with hypoglycemia without coma: Secondary | ICD-10-CM | POA: Diagnosis present

## 2015-08-16 DIAGNOSIS — I25119 Atherosclerotic heart disease of native coronary artery with unspecified angina pectoris: Secondary | ICD-10-CM | POA: Diagnosis not present

## 2015-08-16 DIAGNOSIS — R7989 Other specified abnormal findings of blood chemistry: Secondary | ICD-10-CM | POA: Diagnosis not present

## 2015-08-16 DIAGNOSIS — I130 Hypertensive heart and chronic kidney disease with heart failure and stage 1 through stage 4 chronic kidney disease, or unspecified chronic kidney disease: Secondary | ICD-10-CM | POA: Diagnosis present

## 2015-08-16 DIAGNOSIS — Z794 Long term (current) use of insulin: Secondary | ICD-10-CM | POA: Diagnosis not present

## 2015-08-16 DIAGNOSIS — Z7982 Long term (current) use of aspirin: Secondary | ICD-10-CM | POA: Diagnosis not present

## 2015-08-16 DIAGNOSIS — R0600 Dyspnea, unspecified: Secondary | ICD-10-CM | POA: Diagnosis not present

## 2015-08-16 DIAGNOSIS — D649 Anemia, unspecified: Secondary | ICD-10-CM | POA: Diagnosis not present

## 2015-08-16 DIAGNOSIS — Z87891 Personal history of nicotine dependence: Secondary | ICD-10-CM | POA: Diagnosis not present

## 2015-08-16 DIAGNOSIS — D638 Anemia in other chronic diseases classified elsewhere: Secondary | ICD-10-CM | POA: Diagnosis present

## 2015-08-16 DIAGNOSIS — I252 Old myocardial infarction: Secondary | ICD-10-CM | POA: Diagnosis not present

## 2015-08-16 DIAGNOSIS — R0602 Shortness of breath: Secondary | ICD-10-CM | POA: Diagnosis present

## 2015-08-16 DIAGNOSIS — I25118 Atherosclerotic heart disease of native coronary artery with other forms of angina pectoris: Secondary | ICD-10-CM | POA: Diagnosis present

## 2015-08-16 DIAGNOSIS — Z882 Allergy status to sulfonamides status: Secondary | ICD-10-CM | POA: Diagnosis not present

## 2015-08-16 DIAGNOSIS — I2510 Atherosclerotic heart disease of native coronary artery without angina pectoris: Secondary | ICD-10-CM | POA: Diagnosis not present

## 2015-08-16 DIAGNOSIS — E1122 Type 2 diabetes mellitus with diabetic chronic kidney disease: Secondary | ICD-10-CM | POA: Diagnosis present

## 2015-08-16 DIAGNOSIS — I5032 Chronic diastolic (congestive) heart failure: Secondary | ICD-10-CM | POA: Diagnosis present

## 2015-08-16 LAB — BASIC METABOLIC PANEL
ANION GAP: 10 (ref 5–15)
BUN: 38 mg/dL — AB (ref 6–20)
CHLORIDE: 108 mmol/L (ref 101–111)
CO2: 28 mmol/L (ref 22–32)
Calcium: 9.5 mg/dL (ref 8.9–10.3)
Creatinine, Ser: 2.37 mg/dL — ABNORMAL HIGH (ref 0.61–1.24)
GFR calc Af Amer: 29 mL/min — ABNORMAL LOW (ref >60)
GFR calc non Af Amer: 25 mL/min — ABNORMAL LOW (ref >60)
GLUCOSE: 83 mg/dL (ref 65–99)
POTASSIUM: 4.4 mmol/L (ref 3.5–5.1)
Sodium: 146 mmol/L — ABNORMAL HIGH (ref 135–145)

## 2015-08-16 LAB — CBC
HEMATOCRIT: 35.5 % — AB (ref 39.0–52.0)
Hemoglobin: 11.9 g/dL — ABNORMAL LOW (ref 13.0–17.0)
MCH: 32.4 pg (ref 26.0–34.0)
MCHC: 33.5 g/dL (ref 30.0–36.0)
MCV: 96.7 fL (ref 78.0–100.0)
Platelets: 177 10*3/uL (ref 150–400)
RBC: 3.67 MIL/uL — AB (ref 4.22–5.81)
RDW: 14.5 % (ref 11.5–15.5)
WBC: 7.3 10*3/uL (ref 4.0–10.5)

## 2015-08-16 LAB — HEPARIN LEVEL (UNFRACTIONATED)
Heparin Unfractionated: 0.21 IU/mL — ABNORMAL LOW (ref 0.30–0.70)
Heparin Unfractionated: 0.53 IU/mL (ref 0.30–0.70)

## 2015-08-16 LAB — GLUCOSE, CAPILLARY
GLUCOSE-CAPILLARY: 89 mg/dL (ref 65–99)
GLUCOSE-CAPILLARY: 72 mg/dL (ref 65–99)
GLUCOSE-CAPILLARY: 182 mg/dL — AB (ref 65–99)
GLUCOSE-CAPILLARY: 143 mg/dL — AB (ref 65–99)
GLUCOSE-CAPILLARY: 188 mg/dL — AB (ref 65–99)
Glucose-Capillary: 59 mg/dL — ABNORMAL LOW (ref 65–99)

## 2015-08-16 LAB — ECHOCARDIOGRAM COMPLETE
HEIGHTINCHES: 65 in
WEIGHTICAEL: 2744 [oz_av]

## 2015-08-16 LAB — TROPONIN I
Troponin I: 0.32 ng/mL — ABNORMAL HIGH (ref <0.031)
Troponin I: 0.34 ng/mL — ABNORMAL HIGH (ref <0.031)

## 2015-08-16 LAB — HEMOGLOBIN A1C
HEMOGLOBIN A1C: 6.9 % — AB (ref 4.8–5.6)
MEAN PLASMA GLUCOSE: 151 mg/dL

## 2015-08-16 LAB — FOLATE RBC
FOLATE, HEMOLYSATE: 395.9 ng/mL
Folate, RBC: 1154 ng/mL (ref >498)
Hematocrit: 34.3 % — ABNORMAL LOW (ref 37.5–51.0)

## 2015-08-16 MED ORDER — ATORVASTATIN CALCIUM 80 MG PO TABS
80.0000 mg | ORAL_TABLET | Freq: Every day | ORAL | Status: DC
  Administered 2015-08-16 – 2015-08-21 (×6): 80 mg via ORAL
  Filled 2015-08-16 (×4): qty 2
  Filled 2015-08-16 (×2): qty 1

## 2015-08-16 MED ORDER — IPRATROPIUM-ALBUTEROL 0.5-2.5 (3) MG/3ML IN SOLN
3.0000 mL | Freq: Four times a day (QID) | RESPIRATORY_TRACT | Status: DC
Start: 2015-08-16 — End: 2015-08-18
  Administered 2015-08-16 – 2015-08-18 (×11): 3 mL via RESPIRATORY_TRACT
  Filled 2015-08-16 (×9): qty 3

## 2015-08-16 MED ORDER — GUAIFENESIN-DM 100-10 MG/5ML PO SYRP
5.0000 mL | ORAL_SOLUTION | ORAL | Status: DC | PRN
  Administered 2015-08-16 – 2015-08-20 (×3): 5 mL via ORAL
  Filled 2015-08-16: qty 10
  Filled 2015-08-16 (×2): qty 5

## 2015-08-16 MED ORDER — INSULIN GLARGINE 100 UNIT/ML ~~LOC~~ SOLN
20.0000 [IU] | Freq: Every day | SUBCUTANEOUS | Status: DC
  Administered 2015-08-16 – 2015-08-18 (×3): 20 [IU] via SUBCUTANEOUS
  Filled 2015-08-16 (×3): qty 0.2

## 2015-08-16 NOTE — Progress Notes (Signed)
Hypoglycemic Event  CBG: 52  Treatment: 15 GM carbohydrate snack  Symptoms: None  Follow-up CBG: Time:0820 CBG Result: 87  Possible Reasons for Event: Inadequate meal intake  Comments/MD notified: Tat    Roe Rutherford

## 2015-08-16 NOTE — Progress Notes (Signed)
*  PRELIMINARY RESULTS* Echocardiogram 2D Echocardiogram has been performed.  Daniel Pacheco 08/16/2015, 2:26 PM

## 2015-08-16 NOTE — Progress Notes (Signed)
ANTICOAGULATION CONSULT NOTE - Follow Up Consult  Pharmacy Consult for Heparin Indication: chest pain/ACS  Allergies  Allergen Reactions  . Nsaids     Pt has kidney problems, wants to avoid all nsaids  . Sulfa Antibiotics Rash    Patient Measurements: Height: 5\' 5"  (165.1 cm) Weight: 171 lb 8 oz (77.792 kg) IBW/kg (Calculated) : 61.5 Heparin Dosing Weight:   Vital Signs: Temp: 97.7 F (36.5 C) (04/13 2047) Temp Source: Oral (04/13 2047) BP: 148/48 mmHg (04/13 2047) Pulse Rate: 81 (04/13 2047)  Labs:  Recent Labs  08/15/15 1353 08/15/15 1858 08/15/15 1942 08/16/15 0055 08/16/15 0234  HGB 11.9*  --   --   --  11.9*  HCT 35.8*  --   --   --  35.5*  PLT 185  --   --   --  177  APTT  --  38*  --   --   --   LABPROT  --  13.4  --   --   --   INR  --  1.00  --   --   --   HEPARINUNFRC  --   --   --   --  0.21*  CREATININE 2.49*  --   --   --  2.37*  TROPONINI  --   --  0.41* 0.32*  --     Estimated Creatinine Clearance: 26.3 mL/min (by C-G formula based on Cr of 2.37).   Medications:  Infusions:  . heparin 1,000 Units/hr (08/15/15 2115)    Assessment: Patient with low heparin level.  Heparin drip started later than thought and without bolus ordered.  No other heparin issues per RN.  Drip starting later without bolus could make level lower than expected.  Goal of Therapy:  Heparin level 0.3-0.7 units/ml Monitor platelets by anticoagulation protocol: Yes   Plan:  Increase heparin to 1100 units/hr Recheck level at [place] 08/16/2015,4:45 AM

## 2015-08-16 NOTE — Progress Notes (Signed)
ANTICOAGULATION CONSULT NOTE - Follow Up Consult  Pharmacy Consult for Heparin Indication: chest pain/ACS  Allergies  Allergen Reactions  . Nsaids     Pt has kidney problems, wants to avoid all nsaids  . Sulfa Antibiotics Rash    Patient Measurements: Height: 5\' 5"  (165.1 cm) Weight: 171 lb 8 oz (77.792 kg) IBW/kg (Calculated) : 61.5 Heparin Dosing Weight: 77 kg  Vital Signs: Temp: 97.9 F (36.6 C) (04/14 1443) Temp Source: Oral (04/14 1443) BP: 132/77 mmHg (04/14 1443) Pulse Rate: 83 (04/14 1443)  Labs:  Recent Labs  08/15/15 1353 08/15/15 1858 08/15/15 1942 08/16/15 0055 08/16/15 0234 08/16/15 0633 08/16/15 1251  HGB 11.9*  --   --   --  11.9*  --   --   HCT 35.8*  --   --   --  35.5*  --   --   PLT 185  --   --   --  177  --   --   APTT  --  38*  --   --   --   --   --   LABPROT  --  13.4  --   --   --   --   --   INR  --  1.00  --   --   --   --   --   HEPARINUNFRC  --   --   --   --  0.21*  --  0.53  CREATININE 2.49*  --   --   --  2.37*  --   --   TROPONINI  --   --  0.41* 0.32*  --  0.34*  --     Estimated Creatinine Clearance: 26.3 mL/min (by C-G formula based on Cr of 2.37).   Medications:  Infusions:  . heparin 1,100 Units/hr (08/16/15 0507)    Assessment: Patient is a 75 y.o M presented to the ED on 4/13 with c/o cough and wheezing.  VQ scan showed low probability for PE. He was found to have elevated troponin and heparin was started for r/o ACS.  Cardiology has been consulted.  Plan for echo on 4/14 and possible stress test on 4/15.  Today, 08/16/2015: - heparin level now back therapeutic at 0.53 after rate increased to 1100 units/hr earlier today - cbc stable - no bleeding documented   Goal of Therapy:  Heparin level 0.3-0.7 units/ml Monitor platelets by anticoagulation protocol: Yes   Plan:  - continue heparin drip at 1100 units/hr - f/u with AM labs and adjust rate if needed - monitor for s/s bleeding  Dia Sitter  P 08/16/2015,3:02 PM

## 2015-08-16 NOTE — Consult Note (Signed)
CARDIOLOGY CONSULT NOTE   Patient ID: Daniel Pacheco MRN: [id number] DOB/AGE: Sep 24, 1940 75 y.o.  Admit date: 08/15/2015  Requesting Physician: Dr. Carles Collet Primary Physician:   No primary care provider on file. Primary Cardiologist:  Cardiologist in [place] Reason for Consultation:  NSTEMI  HPI: Daniel Pacheco is a 75 y.o. male with a history of prostate cancer in remission, IDDM, HTN, CKD, and CAD s/p acute MI 5 years ago (medically managed) who presented to the [facility] ED on 08/15/15 with complaints of cough and wheeze. His troponin was noted to be elevated with an abnormal EKG and cardiology was consult.  He lives in [place] but is in [place] visiting his daughter for several months. He is followed closely by cardiologist at home in [place]. He was in his usual state of health until this past week when he started noticing what he calls a "whistle in his throat." He denies shortness of breath or any chest pain. 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 This has not been any worse than previous. He has a history of a myocardial infarction about 5 years ago for which they did a heart catheterization in [place] but did not perform any intervention. He is been treated medically since. He thinks he had a stress test a couple years ago, but isn't sure.  He is feeling better today after a few breathing treatments. He is still quite wheezy on exam. He had a cough yesterday that was productive of brown sputum. However his sputum today is now clear.   In the emergency department his chest x-ray was clear. His d-dimer was mildly elevated but a subsequent V/Q scan was low probability for PE. His troponin was noted to be elevated with a low and flat trend: 0.41-->0.32--> 0.34. Creat 2.37.   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 He was told that this is led to his  CKD. He did smoke but quit over 20 years ago. He denies lower extremity edema, orthopnea or PND. No dizziness or syncope.  Past Medical History  Diagnosis Date  . Hypertension   . Diabetes mellitus without complication ([place])   . Cancer [facility])      History reviewed. No pertinent past surgical history.  Allergies  Allergen Reactions  . Nsaids     Pt has kidney problems, wants to avoid all nsaids  . Sulfa Antibiotics Rash    I have reviewed the patient's current medications . ALPRAZolam  0.5 mg Oral QHS  . aspirin EC  81 mg Oral Daily  . atorvastatin  40 mg Oral q1800  . gabapentin  600 mg Oral QHS  . insulin aspart  0-15 Units Subcutaneous TID WC  . insulin glargine  20 Units Subcutaneous Daily  . ipratropium-albuterol  3 mL Nebulization Q6H  . isosorbide mononitrate  60 mg Oral Daily  . metoprolol  50 mg Oral BID  . pantoprazole  80 mg Oral Daily   . heparin 1,100 Units/hr (08/16/15 0507)   acetaminophen, ALPRAZolam, ipratropium-albuterol, nitroGLYCERIN, ondansetron (ZOFRAN) IV, technetium TC 57M diethylenetriame-pentaacetic acid  Prior to Admission medications   Medication Sig Start Date End Date Taking? Authorizing Provider  acetaminophen (TYLENOL) 500 MG tablet Take 500 mg by mouth daily as needed for moderate pain.   Yes Historical Provider, MD  ALPRAZolam Duanne Moron) 1 MG tablet Take 0.5 mg  by mouth at bedtime.   Yes Historical Provider, MD  aspirin EC 81 MG tablet Take 81 mg by mouth daily.   Yes Historical Provider, MD  gabapentin (NEURONTIN) 600 MG tablet Take 600 mg by mouth at bedtime.   Yes Historical Provider, MD  insulin glargine (LANTUS) 100 UNIT/ML injection Inject 20-45 Units into the skin at bedtime. Inject 45 units in the morning and in the evening take anywhere from 20 to 35 units depending on blood glucose levels   Yes Historical Provider, MD  isosorbide mononitrate (IMDUR) 60 MG 24 hr tablet Take 60 mg by mouth daily.   Yes Historical Provider, MD  metoprolol  (LOPRESSOR) 50 MG tablet Take 50 mg by mouth 2 (two) times daily.   Yes Historical Provider, MD  omeprazole (PRILOSEC OTC) 20 MG tablet Take 20 mg by mouth daily as needed (acid reflux).   Yes Historical Provider, MD     Social History   Social History  . Marital Status: Married    Spouse Name: N/A  . Number of Children: N/A  . Years of Education: N/A   Occupational History  . Not on file.   Social History Main Topics  . Smoking status: Former Research scientist (life sciences)  . Smokeless tobacco: Not on file  . Alcohol Use: No  . Drug Use: Not on file  . Sexual Activity: Not on file   Other Topics Concern  . Not on file   Social History Narrative  . No narrative on file    No family status information on file.   History reviewed. No pertinent family history.   ROS:  Full 14 point review of systems complete and found to be negative unless listed above.  Physical Exam: Blood pressure 136/76, pulse 81, temperature 97.7 F (36.5 C), temperature source Oral, resp. rate 16, height 5\' 5"  (1.651 m), weight 171 lb 8 oz (77.792 kg), SpO2 99 %.  General: Well developed, well nourished, male in no acute distress Head: Eyes PERRLA, No xanthomas.   Normocephalic and atraumatic, oropharynx without edema or exudate. Dentition:  Lungs: diffuse wheezing on exam  Heart: HRRR S1 S2, no rub/gallop, Heart iregular rate and rhythm with S1, S2  murmur. pulses are 2+ extrem.   Neck: No carotid bruits. No lymphadenopathy. No  JVD. Abdomen: Bowel sounds present, abdomen soft and non-tender without masses or hernias noted. Msk:  No spine or cva tenderness. No weakness, no joint deformities or effusions. Extremities: No clubbing or cyanosis.  No LE edema.  Neuro: Alert and oriented X 3. No focal deficits noted. Psych:  Good affect, responds appropriately Skin: No rashes or lesions noted.  Labs:   Lab Results  Component Value Date   WBC 7.3 08/16/2015   HGB 11.9* 08/16/2015   HCT 35.5* 08/16/2015   MCV 96.7  08/16/2015   PLT 177 08/16/2015    Recent Labs  08/15/15 1858  INR 1.00    Recent Labs Lab 08/16/15 0234  NA 146*  K 4.4  CL 108  CO2 28  BUN 38*  CREATININE 2.37*  CALCIUM 9.5  GLUCOSE 83   No results found for: MG  Recent Labs  08/15/15 1942 08/16/15 0055 08/16/15 0633  TROPONINI 0.41* 0.32* 0.34*    Recent Labs  08/15/15 1401  TROPIPOC 0.50*   No results found for: PROBNP No results found for: CHOL, HDL, LDLCALC, TRIG Lab Results  Component Value Date   DDIMER 0.89* 08/15/2015   No results found for: LIPASE, AMYLASE No results found  for: TSH, T4TOTAL, T3FREE, THYROIDAB VITAMIN B-12  Date/Time Value Ref Range Status  08/15/2015 07:43 PM 582 180 - 914 pg/mL Final    Comment:    (NOTE) This assay is not validated for testing neonatal or myeloproliferative syndrome specimens for Vitamin B12 levels. Performed at [facility]  Date/Time Value Ref Range Status  08/15/2015 07:43 PM 99 24 - 336 ng/mL Final    Comment:    Performed at [facility]   TIBC  Date/Time Value Ref Range Status  08/15/2015 07:43 PM 353 250 - 450 ug/dL Final   IRON  Date/Time Value Ref Range Status  08/15/2015 07:43 PM 53 45 - 182 ug/dL Final    Echo: pending  ECG:  HR 77 NSR with sinus arrhythmia ST & T wave abnormality in anterolateral leads.  No previous for comparison.   Radiology:  Dg Chest 2 View  08/15/2015  CLINICAL DATA:  Shortness of breath with cough and mid chest pain 1 week. History of prostate cancer. EXAM: CHEST  2 VIEW COMPARISON:  None. FINDINGS: Lungs are somewhat hypoinflated without focal consolidation or effusion. Cardiomediastinal silhouette is within normal. There is calcified plaque over the aortic arch. Mild degenerate change of the spine and shoulders. IMPRESSION: No active cardiopulmonary disease. Electronically Signed   By: Marin Olp M.D.   On: 08/15/2015 14:22   Nm Pulmonary Perf And Vent  08/15/2015  CLINICAL  DATA:  Shortness of breath and cough for several days EXAM: NUCLEAR MEDICINE VENTILATION - PERFUSION LUNG SCAN Views: Anterior, posterior, left lateral, right lateral, RPO, LPO, RAO, LAO -ventilation and perfusion RADIOPHARMACEUTICALS:  31.8 mCi Technetium-3m DTPA aerosol inhalation and 4.4 mCi Technetium-46m MAA IV COMPARISON:  Chest radiograph August 15, 2015 FINDINGS: Ventilation: There are patchy areas of decreased ventilation, primarily in the upper lobes, in a nonsegmental distribution. No well-defined segmental ventilation defects evident. Perfusion: There are scattered nonsegmental perfusion defects which essentially match the ventilation defects. There is no appreciable ventilation/perfusion mismatch. IMPRESSION: There are scattered matching nonsegmental ventilation and perfusion defects. There is no appreciable ventilation/ perfusion mismatch. These findings constitute an overall low probability of pulmonary embolus. Electronically Signed   By: Lowella Grip III M.D.   On: 08/15/2015 17:26    ASSESSMENT AND PLAN:    Principal Problem:   NSTEMI, initial episode of care [facility]) Active Problems:   SOB (shortness of breath)   Insulin dependent diabetes mellitus (HCC)   Essential hypertension   CKD (chronic kidney disease)   Normocytic anemia   H/O prostate cancer   Left shoulder pain   Bronchospasm   Respiratory distress  Daniel Pacheco is a 75 y.o. male with a history of prostate cancer in remission, IDDM, HTN, CKD, and CAD s/p acute MI 5 years ago (medically managed) who presented to the [facility] ED on 08/15/15 with complaints of cough and wheeze. His troponin was noted to be elevated with an abnormal EKG and cardiology was consult.  Elevated troponin: low and flat trend: 0.41-->0.32--> 0.34. ECG with significant ST changes in anterolateral leads, but no previous for comparison. He has had no chest pain at all. He does report what sounds like chronic stable angina. 2D ECHO pending. Will wait  on results of that before planning further ischemic work up. Do not want to rush to cath with CKD. Continue IV heparin for now. -- Continue ASA and BB. Will add a statin.   Wheezing: chest x-ray clear. Continue duonebs per IM.  Elevated D-dimer:  V/Q scan was low probability for PE.   CKD: Creat 2.37. Unclear baseline. Avoid nephrotoxins.   IDDM: HgA1c 6.9. SSI per IM   Signed: Eileen Stanford, PA-C 08/16/2015 9:04 AM  Pager [phone]  Co-Sign MD  History and all data above reviewed.  Patient examined.  I agree with the findings as above.  No chest pain.   Came in for wheezing and SOB.  However, he has a history of MI (no records) an abnormal EKG and mildly elevated cardiac enzymes.  The patient exam reveals COR:RRR  ,  Lungs: Diffuse wheezing  ,  Abd: Positive bowel sounds, no rebound no guarding, Ext No edema  .  All available labs, radiology testing, previous records reviewed. Agree with documented assessment and plan. Elevated enzymes:  Difficult to know what to make of this.  Question demand ischemia with primary issue beings the lungs.  I would like to see an echo.  It would be very helpful seeing an old EKG.  I will tentatively schedule for a [facility] tomorrow pending the results of the echo.  He is high risk for cath and I don't think that this is indicated as the first study.    Daniel Pacheco  11:15 AM  08/16/2015

## 2015-08-16 NOTE — Progress Notes (Signed)
PROGRESS NOTE  Daniel Pacheco [id number] DOB: May 25, 1940 DOA: 08/15/2015 PCP: No primary care provider on file. Brief History 75 y/o male with history of DM2, HTN, CKD, CAD with hx of MI presented with one week hx of sob, cough and wheezing.  He is in [place] visiting his daughter from [place].  He is mostly complaining of dyspnea on exertion, although he states he has had some chest discomfort with walking up the stairs at his daughter's house.  He denies any f/c or sick contacts.  He denies any dizziness, N/V or syncope.  He is a former smoker x 84yrs but quit over 30 years ago. The patient used his daughter's nebulizer without appreciable relief of his symptoms.  Emergency Department, the patient was found to be afebrile without any hypoxemia and was hemodynamically stable. He was found to have elevated troponin and d-dimer. VQ scan was low probability.He was noted to have an elevated troponin of 0.50. Because of his presentation and abnormal EKG,  The patient was started on heparin drip and admitted for further evaluation.   Assessment/Plan: abnormal EKG/elevated troponin/CAD history -Concern about anginal equivalent presenting with sob and wheeze -pt did endorse some exertional chest discomfort this past week -consulted cardiology -continue heparin drip pending cardiology evaluation -Echo -continue ASA and Imdur  Dyspnea with exertion/wheezing/bronchospasm -hold steroids for now  -start bronchodilators -CXR without acute abnormalities -stable on RA  CKD unknown baseline -serum creatinine 2.49 at time of admission -trend   DM2 -check A1C -start reduced dose Lantus -mild hypoglycemia am 4/14  HTN -continue metoprolol  Anemia -B12--582 -iron sat 15% -plan to eventually start po iron  HLD -continue statin   Family Communication:   Pt at beside--total time 35 min; >50% spent counseling and coordinating care Disposition Plan:   Home when medically  stable       Procedures/Studies: Dg Chest 2 View  08/15/2015  CLINICAL DATA:  Shortness of breath with cough and mid chest pain 1 week. History of prostate cancer. EXAM: CHEST  2 VIEW COMPARISON:  None. FINDINGS: Lungs are somewhat hypoinflated without focal consolidation or effusion. Cardiomediastinal silhouette is within normal. There is calcified plaque over the aortic arch. Mild degenerate change of the spine and shoulders. IMPRESSION: No active cardiopulmonary disease. Electronically Signed   By: Marin Olp M.D.   On: 08/15/2015 14:22   Nm Pulmonary Perf And Vent  08/15/2015  CLINICAL DATA:  Shortness of breath and cough for several days EXAM: NUCLEAR MEDICINE VENTILATION - PERFUSION LUNG SCAN Views: Anterior, posterior, left lateral, right lateral, RPO, LPO, RAO, LAO -ventilation and perfusion RADIOPHARMACEUTICALS:  31.8 mCi Technetium-13m DTPA aerosol inhalation and 4.4 mCi Technetium-6m MAA IV COMPARISON:  Chest radiograph August 15, 2015 FINDINGS: Ventilation: There are patchy areas of decreased ventilation, primarily in the upper lobes, in a nonsegmental distribution. No well-defined segmental ventilation defects evident. Perfusion: There are scattered nonsegmental perfusion defects which essentially match the ventilation defects. There is no appreciable ventilation/perfusion mismatch. IMPRESSION: There are scattered matching nonsegmental ventilation and perfusion defects. There is no appreciable ventilation/ perfusion mismatch. These findings constitute an overall low probability of pulmonary embolus. Electronically Signed   By: Lowella Grip III M.D.   On: 08/15/2015 17:26         Subjective:   Objective: Filed Vitals:   08/15/15 1740 08/15/15 1914 08/15/15 2047 08/16/15 0500  BP:  155/76 148/48 136/76  Pulse:  88 81 81  Temp:  97.7 F (36.5 C) 97.7 F (36.5 C)  TempSrc:   Oral Oral  Resp:  14  16  Height: 5\' 5"  (1.651 m)  5\' 5"  (1.651 m)   Weight: 76.658 kg (169  lb)  77.792 kg (171 lb 8 oz)   SpO2:  99% 99% 99%    Intake/Output Summary (Last 24 hours) at 08/16/15 0835 Last data filed at 08/15/15 2300  Gross per 24 hour  Intake   17.5 ml  Output      0 ml  Net   17.5 ml   Weight change:  Exam:   General:  Pt is alert, follows commands appropriately, not in acute distress  HEENT: No icterus, No thrush, No neck mass, [place]/AT  Cardiovascular: RRR, S1/S2, no rubs, no gallops  Respiratory: CTA bilaterally, no wheezing, no crackles, no rhonchi  Abdomen: Soft/+BS, non tender, non distended, no guarding  Extremities: No edema, No lymphangitis, No petechiae, No rashes, no synovitis  Data Reviewed: Basic Metabolic Panel:  Recent Labs Lab 08/15/15 1353 08/16/15 0234  NA 141 146*  K 4.6 4.4  CL 108 108  CO2 26 28  GLUCOSE 109* 83  BUN 40* 38*  CREATININE 2.49* 2.37*  CALCIUM 9.2 9.5   Liver Function Tests: No results for input(s): AST, ALT, ALKPHOS, BILITOT, PROT, ALBUMIN in the last 168 hours. No results for input(s): LIPASE, AMYLASE in the last 168 hours. No results for input(s): AMMONIA in the last 168 hours. CBC:  Recent Labs Lab 08/15/15 1353 08/16/15 0234  WBC 6.4 7.3  NEUTROABS 3.5  --   HGB 11.9* 11.9*  HCT 35.8* 35.5*  MCV 96.5 96.7  PLT 185 177   Cardiac Enzymes:  Recent Labs Lab 08/15/15 1942 08/16/15 0055 08/16/15 0633  TROPONINI 0.41* 0.32* 0.34*   BNP: Invalid input(s): POCBNP CBG:  Recent Labs Lab 08/15/15 2256  GLUCAP 177*    No results found for this or any previous visit (from the past 240 hour(s)).   Scheduled Meds: . ALPRAZolam  0.5 mg Oral QHS  . aspirin EC  81 mg Oral Daily  . atorvastatin  40 mg Oral q1800  . gabapentin  600 mg Oral QHS  . insulin aspart  0-15 Units Subcutaneous TID WC  . insulin glargine  35 Units Subcutaneous Daily  . isosorbide mononitrate  60 mg Oral Daily  . metoprolol  50 mg Oral BID  . pantoprazole  80 mg Oral Daily   Continuous Infusions: . heparin  1,100 Units/hr (08/16/15 0507)     Mckaila Duffus, DO  [facility] Pager [phone]  If 7PM-7AM, please contact night-coverage [email or web address] Password [facility] 08/16/2015, 8:35 AM

## 2015-08-17 ENCOUNTER — Encounter (HOSPITAL_COMMUNITY)
Admission: EM | Admit: 2015-08-17 | Discharge: 2015-08-17 | Disposition: A | Discharge status: Home / Self Care | Payer: Medicare (Managed Care) | Source: Home / Self Care | Attending: Physician Assistant | Admitting: Physician Assistant

## 2015-08-17 ENCOUNTER — Ambulatory Visit (HOSPITAL_COMMUNITY)
Admission: EM | Admit: 2015-08-17 | Discharge: 2015-08-17 | Disposition: A | Discharge status: Home / Self Care | Payer: Medicare (Managed Care) | Source: Home / Self Care | Attending: Physician Assistant | Admitting: Physician Assistant

## 2015-08-17 VITALS — BP 145/82 | Resp 18

## 2015-08-17 DIAGNOSIS — R7989 Other specified abnormal findings of blood chemistry: Principal | ICD-10-CM

## 2015-08-17 DIAGNOSIS — I214 Non-ST elevation (NSTEMI) myocardial infarction: Principal | ICD-10-CM

## 2015-08-17 DIAGNOSIS — R778 Other specified abnormalities of plasma proteins: Secondary | ICD-10-CM

## 2015-08-17 LAB — HEPARIN LEVEL (UNFRACTIONATED)
HEPARIN UNFRACTIONATED: 0.86 [IU]/mL — AB (ref 0.30–0.70)
Heparin Unfractionated: 1.06 IU/mL — ABNORMAL HIGH (ref 0.30–0.70)
Heparin Unfractionated: 0.72 IU/mL — ABNORMAL HIGH (ref 0.30–0.70)

## 2015-08-17 LAB — NM MYOCAR MULTI W/SPECT W/WALL MOTION / EF
CSEPED: 7 min
CSEPPHR: 98 {beats}/min
Estimated workload: 1 METS
Exercise duration (sec): 20 s
MPHR: 146 {beats}/min
Percent HR: 67 %
Rest HR: 80 {beats}/min

## 2015-08-17 LAB — CBC
HEMATOCRIT: 31.7 % — AB (ref 39.0–52.0)
Hemoglobin: 10.6 g/dL — ABNORMAL LOW (ref 13.0–17.0)
MCH: 32.5 pg (ref 26.0–34.0)
MCHC: 33.4 g/dL (ref 30.0–36.0)
MCV: 97.2 fL (ref 78.0–100.0)
PLATELETS: 157 10*3/uL (ref 150–400)
RBC: 3.26 MIL/uL — ABNORMAL LOW (ref 4.22–5.81)
RDW: 14.9 % (ref 11.5–15.5)
WBC: 6.2 10*3/uL (ref 4.0–10.5)

## 2015-08-17 LAB — GLUCOSE, CAPILLARY
GLUCOSE-CAPILLARY: 165 mg/dL — AB (ref 65–99)
Glucose-Capillary: 77 mg/dL (ref 65–99)
Glucose-Capillary: 159 mg/dL — ABNORMAL HIGH (ref 65–99)

## 2015-08-17 LAB — BASIC METABOLIC PANEL
Anion gap: 10 (ref 5–15)
BUN: 37 mg/dL — AB (ref 6–20)
CALCIUM: 9 mg/dL (ref 8.9–10.3)
CO2: 27 mmol/L (ref 22–32)
CREATININE: 2.77 mg/dL — AB (ref 0.61–1.24)
Chloride: 105 mmol/L (ref 101–111)
GFR calc Af Amer: 24 mL/min — ABNORMAL LOW (ref >60)
GFR, EST NON AFRICAN AMERICAN: 21 mL/min — AB (ref >60)
GLUCOSE: 107 mg/dL — AB (ref 65–99)
Potassium: 4.6 mmol/L (ref 3.5–5.1)
Sodium: 142 mmol/L (ref 135–145)

## 2015-08-17 LAB — MAGNESIUM: Magnesium: 1.7 mg/dL (ref 1.7–2.4)

## 2015-08-17 MED ORDER — TECHNETIUM TC 99M SESTAMIBI GENERIC - CARDIOLITE
30.0000 | Freq: Once | INTRAVENOUS | Status: AC | PRN
  Administered 2015-08-17: 30 via INTRAVENOUS

## 2015-08-17 MED ORDER — HEPARIN (PORCINE) IN NACL 100-0.45 UNIT/ML-% IJ SOLN: 900.0000 [IU]/h | INTRAMUSCULAR | Status: DC

## 2015-08-17 MED ORDER — GABAPENTIN 300 MG PO CAPS
600.0000 mg | ORAL_CAPSULE | Freq: Every day | ORAL | Status: DC
  Administered 2015-08-17 – 2015-08-21 (×5): 600 mg via ORAL
  Filled 2015-08-17 (×5): qty 2

## 2015-08-17 MED ORDER — REGADENOSON 0.4 MG/5ML IV SOLN
0.4000 mg | Freq: Once | INTRAVENOUS | Status: AC
  Administered 2015-08-17: 0.4 mg via INTRAVENOUS

## 2015-08-17 MED ORDER — HEPARIN (PORCINE) IN NACL 100-0.45 UNIT/ML-% IJ SOLN
700.0000 [IU]/h | INTRAMUSCULAR | Status: DC
  Administered 2015-08-17: 700 [IU]/h via INTRAVENOUS
  Filled 2015-08-17: qty 250

## 2015-08-17 MED ORDER — REGADENOSON 0.4 MG/5ML IV SOLN
INTRAVENOUS | Status: AC
  Filled 2015-08-17: qty 5

## 2015-08-17 MED ORDER — IPRATROPIUM-ALBUTEROL 0.5-2.5 (3) MG/3ML IN SOLN
RESPIRATORY_TRACT | Status: AC
  Filled 2015-08-17: qty 3

## 2015-08-17 MED ORDER — TECHNETIUM TC 99M SESTAMIBI GENERIC - CARDIOLITE
10.0000 | Freq: Once | INTRAVENOUS | Status: AC | PRN
Start: 2015-08-17 — End: 2015-08-17
  Administered 2015-08-17: 10 via INTRAVENOUS

## 2015-08-17 MED ORDER — ISOSORBIDE MONONITRATE ER 60 MG PO TB24
60.0000 mg | ORAL_TABLET | Freq: Every day | ORAL | Status: DC
  Administered 2015-08-17 – 2015-08-21 (×5): 60 mg via ORAL
  Filled 2015-08-17: qty 1
  Filled 2015-08-17 (×4): qty 2

## 2015-08-17 NOTE — Progress Notes (Signed)
PROGRESS NOTE  Armour Silver [id number] DOB: 07-24-1940 DOA: 08/15/2015 PCP: No primary care provider on file.  Brief History 75 y/o male with history of DM2, HTN, CKD, CAD with hx of MI presented with one week hx of sob, cough and wheezing. He is in [place] visiting his daughter from [place]. He is mostly complaining of dyspnea on exertion, although he states he has had some chest discomfort with walking up the stairs at his daughter's house. He denies any f/c or sick contacts. He denies any dizziness, N/V or syncope. He is a former smoker x 58yrs but quit over 30 years ago. The patient used his daughter's nebulizer without appreciable relief of his symptoms. Emergency Department, the patient was found to be afebrile without any hypoxemia and was hemodynamically stable. He was found to have elevated troponin and d-dimer. VQ scan was low probability.He was noted to have an elevated troponin of 0.50. Because of his presentation and abnormal EKG, The patient was started on heparin drip and admitted for further evaluation.   Assessment/Plan: abnormal EKG/elevated troponin/CAD history -Concern about anginal equivalent presenting with sob and wheeze -pt did endorse some exertional chest discomfort this past week -Appreciate cardiology -continue heparin drip  -Echo--EF 60-65%, grade 2 DD -08/17/2015 Myoview--reversible defects in the inferior lateral walls global HK the lateral walls, EF 35%; high risk study-->>continue heparin for now -continue ASA and Imdur  Dyspnea with exertion/wheezing/bronchospasm -hold steroids for now  -continue bronchodilators -CXR without acute abnormalities -stable on RA  CKD unknown baseline -serum creatinine 2.49 at time of admission -trend   DM2 -check A1C--6.9 -continue reduced dose Lantus -mild hypoglycemia am 4/14  HTN -continue metoprolol and imdur  Anemia -B12--582 -iron sat 15% -plan to eventually start po  iron  HLD -continue statin--ncreased to 80 mg daily    Procedures/Studies: Dg Chest 2 View  08/15/2015  CLINICAL DATA:  Shortness of breath with cough and mid chest pain 1 week. History of prostate cancer. EXAM: CHEST  2 VIEW COMPARISON:  None. FINDINGS: Lungs are somewhat hypoinflated without focal consolidation or effusion. Cardiomediastinal silhouette is within normal. There is calcified plaque over the aortic arch. Mild degenerate change of the spine and shoulders. IMPRESSION: No active cardiopulmonary disease. Electronically Signed   By: Marin Olp M.D.   On: 08/15/2015 14:22   Nm Myocar Multi W/spect W/wall Motion / Ef  08/17/2015  CLINICAL DATA:  Ex-smoker.  Diabetes.  Hypertension. EXAM: MYOCARDIAL IMAGING WITH SPECT (REST AND PHARMACOLOGIC-STRESS) GATED LEFT VENTRICULAR WALL MOTION STUDY LEFT VENTRICULAR EJECTION FRACTION TECHNIQUE: Standard myocardial SPECT imaging was performed after resting intravenous injection of 10 mCi Tc-35m sestamibi. Subsequently, intravenous infusion of Lexiscan was performed under the supervision of the Cardiology staff. At peak effect of the drug, 30 mCi Tc-59m sestamibi was injected intravenously and standard myocardial SPECT imaging was performed. Quantitative gated imaging was also performed to evaluate left ventricular wall motion, and estimate left ventricular ejection fraction. COMPARISON:  Plain film of 08/15/2015 FINDINGS: Perfusion: Large rest defect involving the inferolateral wall, mid base. Areas of moderate reversibility involving the mid anterior lateral and inferior walls. Wall Motion: Global hypokinesis. Akinesis to dyskinesis involving the lateral wall. Left Ventricular Ejection Fraction: 35 % End diastolic volume 123456 ml End systolic volume 76 ml IMPRESSION: 1. Large rest defect involving the inferior lateral wall with areas of reversibility more anteriorly and inferiorly. 2. Global hypokinesis with dyskinesis involving the lateral wall. 3. Left  ventricular ejection  fraction 35% 4. High-risk stress test findings*. *2012 Appropriate Use Criteria for Coronary Revascularization Focused Update: J Am Coll Cardiol. [id number]. http://content.[email or web address].aspx?articleid=[email or web address] These results will be called to the ordering clinician or representative by the Radiologist Assistant, and communication documented in the PACS or zVision Dashboard. Electronically Signed   By: Abigail Miyamoto M.D.   On: 08/17/2015 13:32   Nm Pulmonary Perf And Vent  08/15/2015  CLINICAL DATA:  Shortness of breath and cough for several days EXAM: NUCLEAR MEDICINE VENTILATION - PERFUSION LUNG SCAN Views: Anterior, posterior, left lateral, right lateral, RPO, LPO, RAO, LAO -ventilation and perfusion RADIOPHARMACEUTICALS:  31.8 mCi Technetium-44m DTPA aerosol inhalation and 4.4 mCi Technetium-2m MAA IV COMPARISON:  Chest radiograph August 15, 2015 FINDINGS: Ventilation: There are patchy areas of decreased ventilation, primarily in the upper lobes, in a nonsegmental distribution. No well-defined segmental ventilation defects evident. Perfusion: There are scattered nonsegmental perfusion defects which essentially match the ventilation defects. There is no appreciable ventilation/perfusion mismatch. IMPRESSION: There are scattered matching nonsegmental ventilation and perfusion defects. There is no appreciable ventilation/ perfusion mismatch. These findings constitute an overall low probability of pulmonary embolus. Electronically Signed   By: Lowella Grip III M.D.   On: 08/15/2015 17:26         Subjective: Patient denies fevers, chills, headache, chest pain, dyspnea, nausea, vomiting, diarrhea, abdominal pain, dysuria, hematuria   Objective: Filed Vitals:   08/17/15 1012 08/17/15 1013 08/17/15 1016 08/17/15 1341  BP: 137/89 134/85 141/85   Pulse:      Temp:      TempSrc:      Resp: 18 18 18    Height:      Weight:      SpO2:    97%    Intake/Output  Summary (Last 24 hours) at 08/17/15 1402 Last data filed at 08/17/15 0759  Gross per 24 hour  Intake    656 ml  Output    400 ml  Net    256 ml   Weight change:  Exam:   General:  Pt is alert, follows commands appropriately, not in acute distress  HEENT: No icterus, No thrush, No neck mass, [place]/AT  Cardiovascular: RRR, S1/S2, no rubs, no gallops  Respiratory: bibasilar expiratory wheeze.good air movement.  Abdomen: Soft/+BS, non tender, non distended, no guarding  Extremities: No edema, No lymphangitis, No petechiae, No rashes, no synovitis  Data Reviewed: Basic Metabolic Panel:  Recent Labs Lab 08/15/15 1353 08/16/15 0234 08/17/15 0434  NA 141 146* 142  K 4.6 4.4 4.6  CL 108 108 105  CO2 26 28 27   GLUCOSE 109* 83 107*  BUN 40* 38* 37*  CREATININE 2.49* 2.37* 2.77*  CALCIUM 9.2 9.5 9.0  MG  --   --  1.7   Liver Function Tests: No results for input(s): AST, ALT, ALKPHOS, BILITOT, PROT, ALBUMIN in the last 168 hours. No results for input(s): LIPASE, AMYLASE in the last 168 hours. No results for input(s): AMMONIA in the last 168 hours. CBC:  Recent Labs Lab 08/15/15 1353 08/15/15 1943 08/16/15 0234 08/17/15 0434  WBC 6.4  --  7.3 6.2  NEUTROABS 3.5  --   --   --   HGB 11.9*  --  11.9* 10.6*  HCT 35.8* 34.3* 35.5* 31.7*  MCV 96.5  --  96.7 97.2  PLT 185  --  177 157   Cardiac Enzymes:  Recent Labs Lab 08/15/15 1942 08/16/15 0055 08/16/15 0633  TROPONINI 0.41* 0.32* 0.34*   BNP: Invalid input(s): POCBNP  CBG:  Recent Labs Lab 08/16/15 1017 08/16/15 1218 08/16/15 1652 08/16/15 2020 08/17/15 1209  GLUCAP 72 182* 143* 188* 77    No results found for this or any previous visit (from the past 240 hour(s)).   Scheduled Meds: . ALPRAZolam  0.5 mg Oral QHS  . aspirin EC  81 mg Oral Daily  . atorvastatin  80 mg Oral q1800  . gabapentin  600 mg Oral QHS  . insulin aspart  0-15 Units Subcutaneous TID WC  . insulin glargine  20 Units  Subcutaneous Daily  . ipratropium-albuterol  3 mL Nebulization Q6H  . isosorbide mononitrate  60 mg Oral Daily  . metoprolol  50 mg Oral BID  . pantoprazole  80 mg Oral Daily   Continuous Infusions: . heparin 900 Units/hr (08/17/15 1200)     Berley Gambrell, DO  [facility] Pager [phone]  If 7PM-7AM, please contact night-coverage [email or web address] Password [email or web address] 08/17/2015, 2:02 PM   LOS: 1 day

## 2015-08-17 NOTE — Progress Notes (Signed)
     The patient was seen in nuclear medicine for a Lexiscan myoview. He tolerated the procedure well. Jettie Booze, NP

## 2015-08-17 NOTE — Progress Notes (Signed)
ANTICOAGULATION CONSULT NOTE - Follow Up Consult  Pharmacy Consult for Heparin Indication: chest pain/ACS  Medications:  Infusions:  . heparin 900 Units/hr (08/17/15 1200)    Assessment: Patient is a 75 y.o M presented to the ED on 4/13 with c/o cough and wheezing.  VQ scan showed low probability for PE. He was found to have elevated troponin and heparin was started for r/o ACS.  Myoview completed on 4/15.  Today, 08/17/2015: - Heparin level 0.86, supratherapeutic on current rate of 900 units/hr despite rate decrease with prior elevated level. - No bleeding or IV complications per RN   Goal of Therapy:  Heparin level 0.3-0.7 units/ml Monitor platelets by anticoagulation protocol: Yes   Plan:  1) Reduce IV heparin to 700 units/hr  2) Recheck heparin level 8 hours after rate change 3) Daily heparin level   Gretta Arab PharmD, BCPS Pager [phone] 08/17/2015 8:45 PM

## 2015-08-17 NOTE — Progress Notes (Signed)
Subjective: Pt in nuclear medcine   Objective: Filed Vitals:   08/16/15 1443 08/16/15 1949 08/16/15 2022 08/17/15 0627  BP: 132/77  143/81 132/65  Pulse: 83 88 93 84  Temp: 97.9 F (36.6 C)  98.2 F (36.8 C) 98.3 F (36.8 C)  TempSrc: Oral  Oral Oral  Resp: 16 16 18 18   Height:      Weight:      SpO2: 98% 95% 99% 100%   Weight change:   Intake/Output Summary (Last 24 hours) at 08/17/15 0719 Last data filed at 08/16/15 2300  Gross per 24 hour  Intake   1016 ml  Output      0 ml  Net   1016 ml    Exam:  Pt not available    Lab Results: Results for orders placed or performed during the hospital encounter of 08/15/15 (from the past 24 hour(s))  Glucose, capillary     Status: Abnormal   Collection Time: 08/16/15  7:42 AM  Result Value Ref Range   Glucose-Capillary 59 (L) 65 - 99 mg/dL   Comment 1 Notify RN    Comment 2 Document in Chart   Glucose, capillary     Status: None   Collection Time: 08/16/15  8:33 AM  Result Value Ref Range   Glucose-Capillary 89 65 - 99 mg/dL   Comment 1 Notify RN    Comment 2 Document in Chart   Glucose, capillary     Status: None   Collection Time: 08/16/15 10:17 AM  Result Value Ref Range   Glucose-Capillary 72 65 - 99 mg/dL  Glucose, capillary     Status: Abnormal   Collection Time: 08/16/15 12:18 PM  Result Value Ref Range   Glucose-Capillary 182 (H) 65 - 99 mg/dL   Comment 1 Notify RN    Comment 2 Document in Chart   Heparin level (unfractionated)     Status: None   Collection Time: 08/16/15 12:51 PM  Result Value Ref Range   Heparin Unfractionated 0.53 0.30 - 0.70 IU/mL  Glucose, capillary     Status: Abnormal   Collection Time: 08/16/15  4:52 PM  Result Value Ref Range   Glucose-Capillary 143 (H) 65 - 99 mg/dL   Comment 1 Notify RN    Comment 2 Document in Chart   Glucose, capillary     Status: Abnormal   Collection Time: 08/16/15  8:20 PM  Result Value Ref Range   Glucose-Capillary 188 (H) 65 - 99 mg/dL   Comment 1  Notify RN    Comment 2 Document in Chart   CBC     Status: Abnormal   Collection Time: 08/17/15  4:34 AM  Result Value Ref Range   WBC 6.2 4.0 - 10.5 K/uL   RBC 3.26 (L) 4.22 - 5.81 MIL/uL   Hemoglobin 10.6 (L) 13.0 - 17.0 g/dL   HCT 31.7 (L) 39.0 - 52.0 %   MCV 97.2 78.0 - 100.0 fL   MCH 32.5 26.0 - 34.0 pg   MCHC 33.4 30.0 - 36.0 g/dL   RDW 14.9 11.5 - 15.5 %   Platelets 157 150 - 400 K/uL  Basic metabolic panel     Status: Abnormal   Collection Time: 08/17/15  4:34 AM  Result Value Ref Range   Sodium 142 135 - 145 mmol/L   Potassium 4.6 3.5 - 5.1 mmol/L   Chloride 105 101 - 111 mmol/L   CO2 27 22 - 32 mmol/L   Glucose, Bld 107 (H)  65 - 99 mg/dL   BUN 37 (H) 6 - 20 mg/dL   Creatinine, Ser 2.77 (H) 0.61 - 1.24 mg/dL   Calcium 9.0 8.9 - 10.3 mg/dL   GFR calc non Af Amer 21 (L) >60 mL/min   GFR calc Af Amer 24 (L) >60 mL/min   Anion gap 10 5 - 15  Magnesium     Status: None   Collection Time: 08/17/15  4:34 AM  Result Value Ref Range   Magnesium 1.7 1.7 - 2.4 mg/dL  Heparin level (unfractionated)     Status: Abnormal   Collection Time: 08/17/15  4:34 AM  Result Value Ref Range   Heparin Unfractionated 1.06 (H) 0.30 - 0.70 IU/mL    Studies/Results: No results found.  Medications:Reviewed    @PROBHOSP @  1  Elevated troponin  Echo yesterday with LVEF being 60 to 65%  MIld AS   Pt getting myoview now.  Off Unit  F/U based on results      LOS: 1 day   Dorris Carnes 08/17/2015, 7:19 AM

## 2015-08-17 NOTE — Progress Notes (Signed)
ANTICOAGULATION CONSULT NOTE - Follow Up Consult  Pharmacy Consult for Heparin Indication: chest pain/ACS  Allergies  Allergen Reactions  . Nsaids     Pt has kidney problems, wants to avoid all nsaids  . Sulfa Antibiotics Rash    Patient Measurements: Height: 5\' 5"  (165.1 cm) Weight: 171 lb 8 oz (77.792 kg) IBW/kg (Calculated) : 61.5 Heparin Dosing Weight: 77 kg  Vital Signs: Temp: 98.3 F (36.8 C) (04/15 0627) Temp Source: Oral (04/15 0627) BP: 132/65 mmHg (04/15 0627) Pulse Rate: 84 (04/15 0627)  Labs:  Recent Labs  08/15/15 1353 08/15/15 1858 08/15/15 1942 08/15/15 1943 08/16/15 0055 08/16/15 0234 08/16/15 0633 08/16/15 1251 08/17/15 0434  HGB 11.9*  --   --   --   --  11.9*  --   --  10.6*  HCT 35.8*  --   --  34.3*  --  35.5*  --   --  31.7*  PLT 185  --   --   --   --  177  --   --  157  APTT  --  38*  --   --   --   --   --   --   --   LABPROT  --  13.4  --   --   --   --   --   --   --   INR  --  1.00  --   --   --   --   --   --   --   HEPARINUNFRC  --   --   --   --   --  0.21*  --  0.53 1.06*  CREATININE 2.49*  --   --   --   --  2.37*  --   --  2.77*  TROPONINI  --   --  0.41*  --  0.32*  --  0.34*  --   --     Estimated Creatinine Clearance: 22.5 mL/min (by C-G formula based on Cr of 2.77).   Medications:  Infusions:  . heparin 1,100 Units/hr (08/16/15 1757)    Assessment: Patient is a 75 y.o M presented to the ED on 4/13 with c/o cough and wheezing.  VQ scan showed low probability for PE. He was found to have elevated troponin and heparin was started for r/o ACS.  Cardiology has been consulted.  Plan for echo on 4/14 and possible stress test on 4/15.  Today, 08/17/2015: - heparin level supratherapeutic on current rate of 1100 units/hr despite being therapeutic yesterday - no bleeding documented per RN - no issues with IV line heparin running in   Goal of Therapy:  Heparin level 0.3-0.7 units/ml Monitor platelets by anticoagulation  protocol: Yes   Plan:  1) Reduce IV heparin to 900 units/hr  2) Recheck heparin level 8 hours after rate change 3) Daily heparin level   Adrian Saran, PharmD, BCPS Pager [phone] 08/17/2015 7:23 AM

## 2015-08-18 DIAGNOSIS — I10 Essential (primary) hypertension: Secondary | ICD-10-CM

## 2015-08-18 DIAGNOSIS — R778 Other specified abnormalities of plasma proteins: Secondary | ICD-10-CM | POA: Insufficient documentation

## 2015-08-18 DIAGNOSIS — R7989 Other specified abnormal findings of blood chemistry: Secondary | ICD-10-CM | POA: Insufficient documentation

## 2015-08-18 LAB — GLUCOSE, CAPILLARY
GLUCOSE-CAPILLARY: 149 mg/dL — AB (ref 65–99)
GLUCOSE-CAPILLARY: 198 mg/dL — AB (ref 65–99)
Glucose-Capillary: 103 mg/dL — ABNORMAL HIGH (ref 65–99)
Glucose-Capillary: 188 mg/dL — ABNORMAL HIGH (ref 65–99)

## 2015-08-18 LAB — CBC
HEMATOCRIT: 35.5 % — AB (ref 39.0–52.0)
Hemoglobin: 11.9 g/dL — ABNORMAL LOW (ref 13.0–17.0)
MCH: 31.7 pg (ref 26.0–34.0)
MCHC: 33.5 g/dL (ref 30.0–36.0)
MCV: 94.7 fL (ref 78.0–100.0)
Platelets: 171 10*3/uL (ref 150–400)
RBC: 3.75 MIL/uL — ABNORMAL LOW (ref 4.22–5.81)
RDW: 14.7 % (ref 11.5–15.5)
WBC: 6.7 10*3/uL (ref 4.0–10.5)

## 2015-08-18 LAB — BASIC METABOLIC PANEL
Anion gap: 11 (ref 5–15)
BUN: 36 mg/dL — AB (ref 6–20)
CHLORIDE: 104 mmol/L (ref 101–111)
CO2: 28 mmol/L (ref 22–32)
Calcium: 9.8 mg/dL (ref 8.9–10.3)
Creatinine, Ser: 2.63 mg/dL — ABNORMAL HIGH (ref 0.61–1.24)
GFR calc Af Amer: 26 mL/min — ABNORMAL LOW (ref >60)
GFR calc non Af Amer: 22 mL/min — ABNORMAL LOW (ref >60)
GLUCOSE: 104 mg/dL — AB (ref 65–99)
POTASSIUM: 4.7 mmol/L (ref 3.5–5.1)
Sodium: 143 mmol/L (ref 135–145)

## 2015-08-18 LAB — HEPARIN LEVEL (UNFRACTIONATED)
HEPARIN UNFRACTIONATED: 0.26 [IU]/mL — AB (ref 0.30–0.70)
Heparin Unfractionated: 0.92 IU/mL — ABNORMAL HIGH (ref 0.30–0.70)
Heparin Unfractionated: 0.38 IU/mL (ref 0.30–0.70)

## 2015-08-18 LAB — BRAIN NATRIURETIC PEPTIDE: B NATRIURETIC PEPTIDE 5: 81.6 pg/mL (ref 0.0–100.0)

## 2015-08-18 MED ORDER — INSULIN GLARGINE 100 UNIT/ML ~~LOC~~ SOLN
8.0000 [IU] | Freq: Every day | SUBCUTANEOUS | Status: DC
  Administered 2015-08-18: 8 [IU] via SUBCUTANEOUS
  Filled 2015-08-18 (×2): qty 0.08

## 2015-08-18 MED ORDER — SODIUM CHLORIDE 0.9 % IV SOLN
INTRAVENOUS | Status: DC
  Administered 2015-08-18 – 2015-08-20 (×2): via INTRAVENOUS

## 2015-08-18 MED ORDER — IPRATROPIUM-ALBUTEROL 0.5-2.5 (3) MG/3ML IN SOLN
3.0000 mL | Freq: Two times a day (BID) | RESPIRATORY_TRACT | Status: DC
  Administered 2015-08-19: 3 mL via RESPIRATORY_TRACT
  Filled 2015-08-18: qty 3

## 2015-08-18 MED ORDER — HEPARIN (PORCINE) IN NACL 100-0.45 UNIT/ML-% IJ SOLN
750.0000 [IU]/h | INTRAMUSCULAR | Status: DC
  Administered 2015-08-18: 500 [IU]/h via INTRAVENOUS
  Administered 2015-08-19: 650 [IU]/h via INTRAVENOUS
  Filled 2015-08-18: qty 250

## 2015-08-18 NOTE — Progress Notes (Addendum)
Subjective: Denies CP  Breathing is OK   Objective: Filed Vitals:   08/17/15 1341 08/17/15 1442 08/17/15 2048 08/18/15 0557  BP:  112/64 145/88 150/86  Pulse:  82 90 90  Temp:  98.7 F (37.1 C) 98.6 F (37 C) 98 F (36.7 C)  TempSrc:  Oral Oral Oral  Resp:  18 18 18   Height:      Weight:      SpO2: 97% 98% 100% 100%   Weight change:   Intake/Output Summary (Last 24 hours) at 08/18/15 [id number] Last data filed at 08/18/15 0600  Gross per 24 hour  Intake    960 ml  Output   1800 ml  Net   -840 ml    General: Alert, awake, oriented x3, in no acute distress Neck:  JVP is normal Heart: Regular rate and rhythm, without murmurs, rubs, gallops.  Lungs: Mild wheeze and faint rales(bases) Exemities:  No edema.   Neuro: Grossly intact, nonfocal.  Lab Results: Results for orders placed or performed during the hospital encounter of 08/15/15 (from the past 24 hour(s))  Glucose, capillary     Status: None   Collection Time: 08/17/15 12:09 PM  Result Value Ref Range   Glucose-Capillary 77 65 - 99 mg/dL  Heparin level (unfractionated)     Status: Abnormal   Collection Time: 08/17/15  4:11 PM  Result Value Ref Range   Heparin Unfractionated 0.72 (H) 0.30 - 0.70 IU/mL  Glucose, capillary     Status: Abnormal   Collection Time: 08/17/15  5:18 PM  Result Value Ref Range   Glucose-Capillary 159 (H) 65 - 99 mg/dL  Heparin level (unfractionated)     Status: Abnormal   Collection Time: 08/17/15  7:30 PM  Result Value Ref Range   Heparin Unfractionated 0.86 (H) 0.30 - 0.70 IU/mL  Glucose, capillary     Status: Abnormal   Collection Time: 08/17/15  8:46 PM  Result Value Ref Range   Glucose-Capillary 165 (H) 65 - 99 mg/dL  CBC     Status: Abnormal   Collection Time: 08/18/15  5:15 AM  Result Value Ref Range   WBC 6.7 4.0 - 10.5 K/uL   RBC 3.75 (L) 4.22 - 5.81 MIL/uL   Hemoglobin 11.9 (L) 13.0 - 17.0 g/dL   HCT 35.5 (L) 39.0 - 52.0 %   MCV 94.7 78.0 - 100.0 fL   MCH 31.7 26.0 - 34.0  pg   MCHC 33.5 30.0 - 36.0 g/dL   RDW 14.7 11.5 - 15.5 %   Platelets 171 150 - 400 K/uL    Studies/Results: Nm Myocar Multi W/spect W/wall Motion / Ef  08/17/2015  CLINICAL DATA:  Ex-smoker.  Diabetes.  Hypertension. EXAM: MYOCARDIAL IMAGING WITH SPECT (REST AND PHARMACOLOGIC-STRESS) GATED LEFT VENTRICULAR WALL MOTION STUDY LEFT VENTRICULAR EJECTION FRACTION TECHNIQUE: Standard myocardial SPECT imaging was performed after resting intravenous injection of 10 mCi Tc-69m sestamibi. Subsequently, intravenous infusion of Lexiscan was performed under the supervision of the Cardiology staff. At peak effect of the drug, 30 mCi Tc-48m sestamibi was injected intravenously and standard myocardial SPECT imaging was performed. Quantitative gated imaging was also performed to evaluate left ventricular wall motion, and estimate left ventricular ejection fraction. COMPARISON:  Plain film of 08/15/2015 FINDINGS: Perfusion: Large rest defect involving the inferolateral wall, mid base. Areas of moderate reversibility involving the mid anterior lateral and inferior walls. Wall Motion: Global hypokinesis. Akinesis to dyskinesis involving the lateral wall. Left Ventricular Ejection Fraction: 35 % End diastolic volume 123456  ml End systolic volume 76 ml IMPRESSION: 1. Large rest defect involving the inferior lateral wall with areas of reversibility more anteriorly and inferiorly. 2. Global hypokinesis with dyskinesis involving the lateral wall. 3. Left ventricular ejection fraction 35% 4. High-risk stress test findings*. *2012 Appropriate Use Criteria for Coronary Revascularization Focused Update: J Am Coll Cardiol. [id number]. http://content.[email or web address].aspx?articleid=[email or web address] These results will be called to the ordering clinician or representative by the Radiologist Assistant, and communication documented in the PACS or zVision Dashboard. Electronically Signed   By: Abigail Miyamoto M.D.   On: 08/17/2015 13:32     Medications:Reviewed  @PROBHOSP @  1  SOB  / elevated troponin (0.92)   Pt with reported MI 5 years ago  Never had CP  Had cath at that time (pt not clear on results)  Rx in medically Cardologist in [place]  Seen not that long ago     Myovue yesterday showed  Large defect in the inferior, lateral and anterior walls with reversibility anteriorly and some inferiorly   LVEF 35%  Echo showed LVEF was 60 to 65% Mild AS   Reviewed with pt  Optimally pt should have R and L heart catheterization to define   But  Cr is 2.6  No records from [place]  Will try to obtain Also will hydrate overnight and repeat labs in AM  At risk for worsening function with cath   Keep NPO after midnight Hold AM lantus    Dorris Carnes       LOS: 2 days   Dorris Carnes 08/18/2015, 6:07 AM

## 2015-08-18 NOTE — Progress Notes (Signed)
ANTICOAGULATION CONSULT NOTE - Follow Up Consult  Pharmacy Consult for Heparin Indication: chest pain/ACS  Allergies  Allergen Reactions  . Nsaids     Pt has kidney problems, wants to avoid all nsaids  . Sulfa Antibiotics Rash    Patient Measurements: Height: 5\' 5"  (165.1 cm) Weight: 168 lb 3.2 oz (76.295 kg) IBW/kg (Calculated) : 61.5 Heparin Dosing Weight:   Vital Signs: Temp: 98 F (36.7 C) (04/16 0557) Temp Source: Oral (04/16 0557) BP: 150/86 mmHg (04/16 0557) Pulse Rate: 90 (04/16 0557)  Labs:  Recent Labs  08/15/15 1353 08/15/15 1858 08/15/15 1942  08/16/15 0055 08/16/15 0234 08/16/15 [id number]  08/17/15 0434 08/17/15 1611 08/17/15 1930 08/18/15 0515  HGB 11.9*  --   --   --   --  11.9*  --   --  10.6*  --   --  11.9*  HCT 35.8*  --   --   < >  --  35.5*  --   --  31.7*  --   --  35.5*  PLT 185  --   --   --   --  177  --   --  157  --   --  171  APTT  --  38*  --   --   --   --   --   --   --   --   --   --   LABPROT  --  13.4  --   --   --   --   --   --   --   --   --   --   INR  --  1.00  --   --   --   --   --   --   --   --   --   --   HEPARINUNFRC  --   --   --   --   --  0.21*  --   < > 1.06* 0.72* 0.86* 0.92*  CREATININE 2.49*  --   --   --   --  2.37*  --   --  2.77*  --   --   --   TROPONINI  --   --  0.41*  --  0.32*  --  0.34*  --   --   --   --   --   < > = values in this interval not displayed.  Estimated Creatinine Clearance: 22.3 mL/min (by C-G formula based on Cr of 2.77).   Medications:  Infusions:  . heparin      Assessment: Patient with high heparin level.  Level increased after rate decrease.  No expected result.  No other heparin issues per RN.  RN verified expected heparin rate as correct.  Goal of Therapy:  Heparin level 0.3-0.7 units/ml Monitor platelets by anticoagulation protocol: Yes   Plan:  Hold heparin x 1 hour Resume heparin at 0730 at a 500 units/hr rate Recheck level at [address] [place]  [place] 08/18/2015,6:23 AM

## 2015-08-18 NOTE — Progress Notes (Signed)
PROGRESS NOTE  Daniel Pacheco [id number] DOB: Aug 13, 1940 DOA: 08/15/2015 PCP: No primary care provider on file. Brief History 75 y/o male with history of DM2, HTN, CKD, CAD with hx of MI presented with one week hx of sob, cough and wheezing. He is in [place] visiting his daughter from [place]. He is mostly complaining of dyspnea on exertion, although he states he has had some chest discomfort with walking up the stairs at his daughter's house. He denies any f/c or sick contacts. He denies any dizziness, N/V or syncope. He is a former smoker x 20yrs but quit over 30 years ago. The patient used his daughter's nebulizer without appreciable relief of his symptoms. Emergency Department, the patient was found to be afebrile without any hypoxemia and was hemodynamically stable. He was found to have elevated troponin and d-dimer. VQ scan was low probability.He was noted to have an elevated troponin of 0.50. Because of his presentation and abnormal EKG, The patient was started on heparin drip and admitted for further evaluation.   Assessment/Plan: abnormal EKG/elevated troponin/CAD history -Concern about anginal equivalent presenting with sob and wheeze -pt did endorse some exertional chest discomfort this past week -Appreciate cardiology -continue heparin drip  -Echo--EF 60-65%, grade 2 DD -08/17/2015 Myoview--reversible defects in the inferior lateral walls global HK the lateral walls, EF 35%; high risk study-->>continue heparin for now -continue ASA and Imdur -possible R+L heart cath 08/19/15 if renal function improves  Dyspnea with exertion/wheezing/bronchospasm -hold steroids for now  -continue bronchodilators -CXR without acute abnormalities -stable on RA  CKD unknown baseline -serum creatinine 2.49 at time of admission -trend   DM2 -check A1C--6.9 -continue reduced dose Lantus-->reduce dose more due to anticipated npo for heart cath 4/17 -mild hypoglycemia am  4/14  HTN -continue metoprolol and imdur  Anemia -B12--582 -iron sat 15% -plan to eventually start po iron  HLD -continue statin--ncreased to 80 mg daily  Family communication--no family at bedside Dispo--pending cardiology work up   Procedures/Studies: Dg Chest 2 View  08/15/2015  CLINICAL DATA:  Shortness of breath with cough and mid chest pain 1 week. History of prostate cancer. EXAM: CHEST  2 VIEW COMPARISON:  None. FINDINGS: Lungs are somewhat hypoinflated without focal consolidation or effusion. Cardiomediastinal silhouette is within normal. There is calcified plaque over the aortic arch. Mild degenerate change of the spine and shoulders. IMPRESSION: No active cardiopulmonary disease. Electronically Signed   By: Marin Olp M.D.   On: 08/15/2015 14:22   Nm Myocar Multi W/spect W/wall Motion / Ef  08/17/2015  CLINICAL DATA:  Ex-smoker.  Diabetes.  Hypertension. EXAM: MYOCARDIAL IMAGING WITH SPECT (REST AND PHARMACOLOGIC-STRESS) GATED LEFT VENTRICULAR WALL MOTION STUDY LEFT VENTRICULAR EJECTION FRACTION TECHNIQUE: Standard myocardial SPECT imaging was performed after resting intravenous injection of 10 mCi Tc-9m sestamibi. Subsequently, intravenous infusion of Lexiscan was performed under the supervision of the Cardiology staff. At peak effect of the drug, 30 mCi Tc-10m sestamibi was injected intravenously and standard myocardial SPECT imaging was performed. Quantitative gated imaging was also performed to evaluate left ventricular wall motion, and estimate left ventricular ejection fraction. COMPARISON:  Plain film of 08/15/2015 FINDINGS: Perfusion: Large rest defect involving the inferolateral wall, mid base. Areas of moderate reversibility involving the mid anterior lateral and inferior walls. Wall Motion: Global hypokinesis. Akinesis to dyskinesis involving the lateral wall. Left Ventricular Ejection Fraction: 35 % End diastolic volume 123456 ml End systolic volume 76 ml IMPRESSION: 1.  Large rest  defect involving the inferior lateral wall with areas of reversibility more anteriorly and inferiorly. 2. Global hypokinesis with dyskinesis involving the lateral wall. 3. Left ventricular ejection fraction 35% 4. High-risk stress test findings*. *2012 Appropriate Use Criteria for Coronary Revascularization Focused Update: J Am Coll Cardiol. [id number]. http://content.[email or web address].aspx?articleid=[email or web address] These results will be called to the ordering clinician or representative by the Radiologist Assistant, and communication documented in the PACS or zVision Dashboard. Electronically Signed   By: Abigail Miyamoto M.D.   On: 08/17/2015 13:32   Nm Pulmonary Perf And Vent  08/15/2015  CLINICAL DATA:  Shortness of breath and cough for several days EXAM: NUCLEAR MEDICINE VENTILATION - PERFUSION LUNG SCAN Views: Anterior, posterior, left lateral, right lateral, RPO, LPO, RAO, LAO -ventilation and perfusion RADIOPHARMACEUTICALS:  31.8 mCi Technetium-21m DTPA aerosol inhalation and 4.4 mCi Technetium-5m MAA IV COMPARISON:  Chest radiograph August 15, 2015 FINDINGS: Ventilation: There are patchy areas of decreased ventilation, primarily in the upper lobes, in a nonsegmental distribution. No well-defined segmental ventilation defects evident. Perfusion: There are scattered nonsegmental perfusion defects which essentially match the ventilation defects. There is no appreciable ventilation/perfusion mismatch. IMPRESSION: There are scattered matching nonsegmental ventilation and perfusion defects. There is no appreciable ventilation/ perfusion mismatch. These findings constitute an overall low probability of pulmonary embolus. Electronically Signed   By: Lowella Grip III M.D.   On: 08/15/2015 17:26         Subjective: Patient denies fevers, chills, headache, chest pain, dyspnea, nausea, vomiting, diarrhea, abdominal pain, dysuria, hematuria   Objective: Filed Vitals:   08/18/15 0734  08/18/15 1042 08/18/15 1337 08/18/15 1556  BP:  142/79  165/75  Pulse:  86  83  Temp:    98.2 F (36.8 C)  TempSrc:    Oral  Resp:    20  Height:      Weight:      SpO2: 93%  95% 100%    Intake/Output Summary (Last 24 hours) at 08/18/15 1805 Last data filed at 08/18/15 1557  Gross per 24 hour  Intake    960 ml  Output   2000 ml  Net  -1040 ml   Weight change:  Exam:   General:  Pt is alert, follows commands appropriately, not in acute distress  HEENT: No icterus, No thrush, No neck mass, [place]/AT  Cardiovascular: RRR, S1/S2, no rubs, no gallops  Respiratory: Bibasilar wheezes. Good air movement.  Abdomen: Soft/+BS, non tender, non distended, no guarding  Extremities: No edema, No lymphangitis, No petechiae, No rashes, no synovitis  Data Reviewed: Basic Metabolic Panel:  Recent Labs Lab 08/15/15 1353 08/16/15 0234 08/17/15 0434 08/18/15 0515  NA 141 146* 142 143  K 4.6 4.4 4.6 4.7  CL 108 108 105 104  CO2 26 28 27 28   GLUCOSE 109* 83 107* 104*  BUN 40* 38* 37* 36*  CREATININE 2.49* 2.37* 2.77* 2.63*  CALCIUM 9.2 9.5 9.0 9.8  MG  --   --  1.7  --    Liver Function Tests: No results for input(s): AST, ALT, ALKPHOS, BILITOT, PROT, ALBUMIN in the last 168 hours. No results for input(s): LIPASE, AMYLASE in the last 168 hours. No results for input(s): AMMONIA in the last 168 hours. CBC:  Recent Labs Lab 08/15/15 1353 08/15/15 1943 08/16/15 0234 08/17/15 0434 08/18/15 0515  WBC 6.4  --  7.3 6.2 6.7  NEUTROABS 3.5  --   --   --   --   HGB 11.9*  --  11.9* 10.6* 11.9*  HCT 35.8* 34.3* 35.5* 31.7* 35.5*  MCV 96.5  --  96.7 97.2 94.7  PLT 185  --  177 157 171   Cardiac Enzymes:  Recent Labs Lab 08/15/15 1942 08/16/15 0055 08/16/15 0633  TROPONINI 0.41* 0.32* 0.34*   BNP: Invalid input(s): POCBNP CBG:  Recent Labs Lab 08/17/15 1718 08/17/15 2046 08/18/15 0732 08/18/15 1133 08/18/15 1722  GLUCAP 159* 165* 103* 188* 149*    No results  found for this or any previous visit (from the past 240 hour(s)).   Scheduled Meds: . ALPRAZolam  0.5 mg Oral QHS  . aspirin EC  81 mg Oral Daily  . atorvastatin  80 mg Oral q1800  . gabapentin  600 mg Oral QHS  . insulin aspart  0-15 Units Subcutaneous TID WC  . ipratropium-albuterol  3 mL Nebulization Q6H  . isosorbide mononitrate  60 mg Oral Daily  . metoprolol  50 mg Oral BID  . pantoprazole  80 mg Oral Daily   Continuous Infusions: . sodium chloride    . heparin 500 Units/hr (08/18/15 0738)     Kaisyn Millea, DO  [facility] Pager [phone]  If 7PM-7AM, please contact night-coverage [email or web address] Password [email or web address] 08/18/2015, 6:05 PM   LOS: 2 days

## 2015-08-18 NOTE — Progress Notes (Signed)
ANTICOAGULATION CONSULT NOTE - Follow Up Consult  Pharmacy Consult for Heparin Indication: chest pain/ACS  Allergies  Allergen Reactions  . Nsaids     Pt has kidney problems, wants to avoid all nsaids  . Sulfa Antibiotics Rash    Patient Measurements: Height: 5\' 5"  (165.1 cm) Weight: 168 lb 3.2 oz (76.295 kg) IBW/kg (Calculated) : 61.5 Heparin Dosing Weight: 77 kg  Vital Signs: Temp: 98.2 F (36.8 C) (04/16 1556) Temp Source: Oral (04/16 1556) BP: 165/75 mmHg (04/16 1556) Pulse Rate: 83 (04/16 1556)  Labs:  Recent Labs  08/15/15 1858 08/15/15 1942  08/16/15 0055  08/16/15 0234 08/16/15 [id number]  08/17/15 0434  08/17/15 1930 08/18/15 0515 08/18/15 1611  HGB  --   --   --   --   < > 11.9*  --   --  10.6*  --   --  11.9*  --   HCT  --   --   < >  --   --  35.5*  --   --  31.7*  --   --  35.5*  --   PLT  --   --   --   --   --  177  --   --  157  --   --  171  --   APTT 38*  --   --   --   --   --   --   --   --   --   --   --   --   LABPROT 13.4  --   --   --   --   --   --   --   --   --   --   --   --   INR 1.00  --   --   --   --   --   --   --   --   --   --   --   --   HEPARINUNFRC  --   --   --   --   --  0.21*  --   < > 1.06*  < > 0.86* 0.92* 0.38  CREATININE  --   --   --   --   --  2.37*  --   --  2.77*  --   --  2.63*  --   TROPONINI  --  0.41*  --  0.32*  --   --  0.34*  --   --   --   --   --   --   < > = values in this interval not displayed.  Estimated Creatinine Clearance: 23.5 mL/min (by C-G formula based on Cr of 2.63).   Medications:  Infusions:  . sodium chloride    . heparin 500 Units/hr (08/18/15 [id number])    Assessment: Patient is a 75 y.o M presented to the ED on 4/13 with c/o cough and wheezing.  VQ scan showed low probability for PE. He was found to have elevated troponin and heparin was started for r/o ACS.  He has hx of CAD- records from [place] has been unavailable thus far.   Cardiology has been consulted.  He had echo on 4/14 and stress test  on 4/15.    Today, 08/18/2015: - heparin level therapeutic (0.38) this afternoon on current rate of 500 units/hr - CBC stable.  No bleeding documented per RN - CKI- hydrating patient for possible cardiac cath tomorrow (4/17)  Goal of  Therapy:  Heparin level 0.3-0.7 units/ml Monitor platelets by anticoagulation protocol: Yes   Plan:  Continue Heparin 500 units/hr  Recheck 6hr heparin level to verify steady state at 10pm  Daily heparin level & CBC  F/U plans re: cardiac cath on Monday

## 2015-08-19 DIAGNOSIS — N184 Chronic kidney disease, stage 4 (severe): Secondary | ICD-10-CM

## 2015-08-19 LAB — CBC
HCT: 33.6 % — ABNORMAL LOW (ref 39.0–52.0)
Hemoglobin: 11.1 g/dL — ABNORMAL LOW (ref 13.0–17.0)
MCH: 32 pg (ref 26.0–34.0)
MCHC: 33 g/dL (ref 30.0–36.0)
MCV: 96.8 fL (ref 78.0–100.0)
PLATELETS: 155 10*3/uL (ref 150–400)
RBC: 3.47 MIL/uL — AB (ref 4.22–5.81)
RDW: 15 % (ref 11.5–15.5)
WBC: 6.4 10*3/uL (ref 4.0–10.5)

## 2015-08-19 LAB — BASIC METABOLIC PANEL
ANION GAP: 8 (ref 5–15)
BUN: 38 mg/dL — AB (ref 6–20)
CALCIUM: 9.3 mg/dL (ref 8.9–10.3)
CO2: 26 mmol/L (ref 22–32)
CREATININE: 2.57 mg/dL — AB (ref 0.61–1.24)
Chloride: 107 mmol/L (ref 101–111)
GFR calc Af Amer: 27 mL/min — ABNORMAL LOW (ref >60)
GFR, EST NON AFRICAN AMERICAN: 23 mL/min — AB (ref >60)
GLUCOSE: 192 mg/dL — AB (ref 65–99)
Potassium: 4.9 mmol/L (ref 3.5–5.1)
Sodium: 141 mmol/L (ref 135–145)

## 2015-08-19 LAB — GLUCOSE, CAPILLARY
GLUCOSE-CAPILLARY: 122 mg/dL — AB (ref 65–99)
GLUCOSE-CAPILLARY: 137 mg/dL — AB (ref 65–99)
GLUCOSE-CAPILLARY: 136 mg/dL — AB (ref 65–99)
Glucose-Capillary: 162 mg/dL — ABNORMAL HIGH (ref 65–99)

## 2015-08-19 LAB — HEPARIN LEVEL (UNFRACTIONATED)
HEPARIN UNFRACTIONATED: 0.33 [IU]/mL (ref 0.30–0.70)
Heparin Unfractionated: 0.4 IU/mL (ref 0.30–0.70)

## 2015-08-19 MED ORDER — SODIUM CHLORIDE 0.9% FLUSH
3.0000 mL | Freq: Two times a day (BID) | INTRAVENOUS | Status: DC
  Administered 2015-08-19: 3 mL via INTRAVENOUS

## 2015-08-19 MED ORDER — SODIUM CHLORIDE 0.9 % IV SOLN: 250.0000 mL | INTRAVENOUS | Status: DC | PRN

## 2015-08-19 MED ORDER — SODIUM CHLORIDE 0.9% FLUSH: 3.0000 mL | INTRAVENOUS | Status: DC | PRN

## 2015-08-19 MED ORDER — IPRATROPIUM-ALBUTEROL 0.5-2.5 (3) MG/3ML IN SOLN
3.0000 mL | Freq: Three times a day (TID) | RESPIRATORY_TRACT | Status: DC
  Administered 2015-08-19 – 2015-08-20 (×3): 3 mL via RESPIRATORY_TRACT
  Filled 2015-08-19 (×4): qty 3

## 2015-08-19 MED ORDER — INSULIN GLARGINE 100 UNIT/ML ~~LOC~~ SOLN
4.0000 [IU] | Freq: Once | SUBCUTANEOUS | Status: AC
  Administered 2015-08-19: 4 [IU] via SUBCUTANEOUS

## 2015-08-19 MED ORDER — SODIUM CHLORIDE 0.9 % IV SOLN
INTRAVENOUS | Status: DC
  Administered 2015-08-19 – 2015-08-20 (×3): via INTRAVENOUS

## 2015-08-19 NOTE — Progress Notes (Signed)
PROGRESS NOTE  Daniel Pacheco [id number] DOB: December 25, 1940 DOA: 08/15/2015 PCP: No primary care provider on file.  Brief History 75 y/o male with history of DM2, HTN, CKD, CAD with hx of MI presented with one week hx of sob, cough and wheezing. He is in [place] visiting his daughter from [place]. He is mostly complaining of dyspnea on exertion, although he states he has had some chest discomfort with walking up the stairs at his daughter's house. He denies any f/c or sick contacts. He denies any dizziness, N/V or syncope. He is a former smoker x 104yrs but quit over 30 years ago. The patient used his daughter's nebulizer without appreciable relief of his symptoms. Emergency Department, the patient was found to be afebrile without any hypoxemia and was hemodynamically stable. He was found to have elevated troponin and d-dimer. VQ scan was low probability.He was noted to have an elevated troponin of 0.50. Because of his presentation and abnormal EKG, The patient was started on heparin drip and admitted for further evaluation.  Cardiology was consulted. The patient had a high risk Myoview. Heart catheterization is planned 08/20/2015.   Assessment/Plan: abnormal EKG/elevated troponin/CAD history -Concern about anginal equivalent presenting with sob and wheeze -pt did endorse some exertional chest discomfort this past week -Appreciate cardiology -continue heparin drip  -Echo--EF 60-65%, grade 2 DD -08/17/2015 Myoview--reversible defects in the inferior lateral walls global HK the lateral walls, EF 35%; high risk study-->>continue heparin for now -continue ASA and Imdur -possible R+L heart cath 08/20/15   Dyspnea with exertion/wheezing/bronchospasm -hold steroids for now  -continue bronchodilators -CXR without acute abnormalities -stable on RA  CKD unknown baseline -serum creatinine 2.49 at time of admission -trend--overall stable  DM2 -check A1C--6.9 -continue reduced  dose Lantus-->reduce dose more due to anticipated npo for heart cath 4/18 -mild hypoglycemia am 4/14  HTN -continue metoprolol and imdur  Anemia -B12--582 -iron sat 15% -plan to eventually start po iron  HLD -continue statin--ncreased to 80 mg daily  Family communication--wife updated at bedside 08/19/15 Dispo--pending cardiology work up   Procedures/Studies: Dg Chest 2 View  08/15/2015  CLINICAL DATA:  Shortness of breath with cough and mid chest pain 1 week. History of prostate cancer. EXAM: CHEST  2 VIEW COMPARISON:  None. FINDINGS: Lungs are somewhat hypoinflated without focal consolidation or effusion. Cardiomediastinal silhouette is within normal. There is calcified plaque over the aortic arch. Mild degenerate change of the spine and shoulders. IMPRESSION: No active cardiopulmonary disease. Electronically Signed   By: Marin Olp M.D.   On: 08/15/2015 14:22   Nm Myocar Multi W/spect W/wall Motion / Ef  08/17/2015  CLINICAL DATA:  Ex-smoker.  Diabetes.  Hypertension. EXAM: MYOCARDIAL IMAGING WITH SPECT (REST AND PHARMACOLOGIC-STRESS) GATED LEFT VENTRICULAR WALL MOTION STUDY LEFT VENTRICULAR EJECTION FRACTION TECHNIQUE: Standard myocardial SPECT imaging was performed after resting intravenous injection of 10 mCi Tc-81m sestamibi. Subsequently, intravenous infusion of Lexiscan was performed under the supervision of the Cardiology staff. At peak effect of the drug, 30 mCi Tc-64m sestamibi was injected intravenously and standard myocardial SPECT imaging was performed. Quantitative gated imaging was also performed to evaluate left ventricular wall motion, and estimate left ventricular ejection fraction. COMPARISON:  Plain film of 08/15/2015 FINDINGS: Perfusion: Large rest defect involving the inferolateral wall, mid base. Areas of moderate reversibility involving the mid anterior lateral and inferior walls. Wall Motion: Global hypokinesis. Akinesis to dyskinesis involving the lateral wall.  Left Ventricular Ejection Fraction: 35 %  End diastolic volume 123456 ml End systolic volume 76 ml IMPRESSION: 1. Large rest defect involving the inferior lateral wall with areas of reversibility more anteriorly and inferiorly. 2. Global hypokinesis with dyskinesis involving the lateral wall. 3. Left ventricular ejection fraction 35% 4. High-risk stress test findings*. *2012 Appropriate Use Criteria for Coronary Revascularization Focused Update: J Am Coll Cardiol. [id number]. http://content.[email or web address].aspx?articleid=[email or web address] These results will be called to the ordering clinician or representative by the Radiologist Assistant, and communication documented in the PACS or zVision Dashboard. Electronically Signed   By: Abigail Miyamoto M.D.   On: 08/17/2015 13:32   Nm Pulmonary Perf And Vent  08/15/2015  CLINICAL DATA:  Shortness of breath and cough for several days EXAM: NUCLEAR MEDICINE VENTILATION - PERFUSION LUNG SCAN Views: Anterior, posterior, left lateral, right lateral, RPO, LPO, RAO, LAO -ventilation and perfusion RADIOPHARMACEUTICALS:  31.8 mCi Technetium-29m DTPA aerosol inhalation and 4.4 mCi Technetium-73m MAA IV COMPARISON:  Chest radiograph August 15, 2015 FINDINGS: Ventilation: There are patchy areas of decreased ventilation, primarily in the upper lobes, in a nonsegmental distribution. No well-defined segmental ventilation defects evident. Perfusion: There are scattered nonsegmental perfusion defects which essentially match the ventilation defects. There is no appreciable ventilation/perfusion mismatch. IMPRESSION: There are scattered matching nonsegmental ventilation and perfusion defects. There is no appreciable ventilation/ perfusion mismatch. These findings constitute an overall low probability of pulmonary embolus. Electronically Signed   By: Lowella Grip III M.D.   On: 08/15/2015 17:26         Subjective: Patient denies fevers, chills, headache, chest pain, dyspnea,  nausea, vomiting, diarrhea, abdominal pain, dysuria, hematuria   Objective: Filed Vitals:   08/19/15 0459 08/19/15 0500 08/19/15 1305 08/19/15 1400  BP: 125/77   127/69  Pulse: 83  78 74  Temp: 98.4 F (36.9 C)   97.8 F (36.6 C)  TempSrc: Oral   Oral  Resp: 18  18 20   Height:      Weight:  76.1 kg (167 lb 12.3 oz)    SpO2: 98%  96% 99%    Intake/Output Summary (Last 24 hours) at 08/19/15 1826 Last data filed at 08/19/15 0459  Gross per 24 hour  Intake 388.75 ml  Output    700 ml  Net -311.25 ml   Weight change: -0.195 kg (-6.9 oz) Exam:   General:  Pt is alert, follows commands appropriately, not in acute distress  HEENT: No icterus, No thrush, No neck mass, /AT  Cardiovascular: RRR, S1/S2, no rubs, no gallops  Respiratory: Bibasilar rales. Soft expiratory wheeze at the bases. Good air movement.  Abdomen: Soft/+BS, non tender, non distended, no guarding  Extremities: No edema, No lymphangitis, No petechiae, No rashes, no synovitis  Data Reviewed: Basic Metabolic Panel:  Recent Labs Lab 08/15/15 1353 08/16/15 0234 08/17/15 0434 08/18/15 0515 08/19/15 1637  NA 141 146* 142 143 141  K 4.6 4.4 4.6 4.7 4.9  CL 108 108 105 104 107  CO2 26 28 27 28 26   GLUCOSE 109* 83 107* 104* 192*  BUN 40* 38* 37* 36* 38*  CREATININE 2.49* 2.37* 2.77* 2.63* 2.57*  CALCIUM 9.2 9.5 9.0 9.8 9.3  MG  --   --  1.7  --   --    Liver Function Tests: No results for input(s): AST, ALT, ALKPHOS, BILITOT, PROT, ALBUMIN in the last 168 hours. No results for input(s): LIPASE, AMYLASE in the last 168 hours. No results for input(s): AMMONIA in the last 168 hours. CBC:  Recent Labs Lab 08/15/15 1353  08/15/15 1943 08/16/15 0234 08/17/15 0434 08/18/15 0515 08/19/15 0431  WBC 6.4  --  7.3 6.2 6.7 6.4  NEUTROABS 3.5  --   --   --   --   --   HGB 11.9*  --  11.9* 10.6* 11.9* 11.1*  HCT 35.8* 34.3* 35.5* 31.7* 35.5* 33.6*  MCV 96.5  --  96.7 97.2 94.7 96.8  PLT 185  --  177 157  171 155   Cardiac Enzymes:  Recent Labs Lab 08/15/15 1942 08/16/15 0055 08/16/15 0633  TROPONINI 0.41* 0.32* 0.34*   BNP: Invalid input(s): POCBNP CBG:  Recent Labs Lab 08/18/15 1722 08/18/15 2130 08/19/15 0821 08/19/15 1152 08/19/15 1707  GLUCAP 149* 198* 122* 137* 162*    No results found for this or any previous visit (from the past 240 hour(s)).   Scheduled Meds: . ALPRAZolam  0.5 mg Oral QHS  . aspirin EC  81 mg Oral Daily  . atorvastatin  80 mg Oral q1800  . gabapentin  600 mg Oral QHS  . insulin aspart  0-15 Units Subcutaneous TID WC  . insulin glargine  8 Units Subcutaneous QHS  . ipratropium-albuterol  3 mL Nebulization TID  . isosorbide mononitrate  60 mg Oral Daily  . metoprolol  50 mg Oral BID  . pantoprazole  80 mg Oral Daily  . sodium chloride flush  3 mL Intravenous Q12H   Continuous Infusions: . sodium chloride 75 mL/hr at 08/18/15 2101  . sodium chloride 150 mL/hr at 08/19/15 1756  . heparin 650 Units/hr (08/19/15 0300)     Thorn Demas, DO  [facility] Pager [phone]  If 7PM-7AM, please contact night-coverage [email or web address] Password [email or web address] 08/19/2015, 6:26 PM   LOS: 3 days

## 2015-08-19 NOTE — Progress Notes (Addendum)
ANTICOAGULATION CONSULT NOTE - Follow Up Consult  Pharmacy Consult for Heparin Indication: chest pain/ACS  Allergies  Allergen Reactions  . Nsaids     Pt has kidney problems, wants to avoid all nsaids  . Sulfa Antibiotics Rash    Patient Measurements: Height: 5\' 5"  (165.1 cm) Weight: 167 lb 12.3 oz (76.1 kg) IBW/kg (Calculated) : 61.5 Heparin Dosing Weight: 77 kg  Vital Signs: Temp: 98.3 F (36.8 C) (04/17 2035) Temp Source: Oral (04/17 1400) BP: 127/69 mmHg (04/17 1400) Pulse Rate: 91 (04/17 2035)  Labs:  Recent Labs  08/17/15 0434  08/18/15 0515  08/18/15 2150 08/19/15 0431 08/19/15 1219 08/19/15 1637 08/19/15 1953  HGB 10.6*  --  11.9*  --   --  11.1*  --   --   --   HCT 31.7*  --  35.5*  --   --  33.6*  --   --   --   PLT 157  --  171  --   --  155  --   --   --   HEPARINUNFRC 1.06*  < > 0.92*  < > 0.26*  --  0.40  --  0.33  CREATININE 2.77*  --  2.63*  --   --   --   --  2.57*  --   < > = values in this interval not displayed.  Estimated Creatinine Clearance: 24 mL/min (by C-G formula based on Cr of 2.57).   Medications:  Infusions:  . sodium chloride 75 mL/hr at 08/18/15 2101  . sodium chloride 150 mL/hr at 08/19/15 1756  . heparin 650 Units/hr (08/19/15 0300)    Assessment: Patient is a 75 y.o M presented to the ED on 4/13 with c/o cough and wheezing.  VQ scan showed low probability for PE. He was found to have elevated troponin and heparin was started for r/o ACS.  He has hx of CAD- records from [place] has been unavailable thus far.   Cardiology has been consulted.  He had echo on 4/14 and stress test on 4/15.    Today, 08/19/2015: - Heparin level remains therapeutic at 0.33 units/ml on heparin infusion at 650 units/hr. - CBC stable.  Per RN, patient with slight bleeding/bruising on arm opposite to where heparin infusing at site where labs drawn.  - CKD-currently hydrating patient, SCr 2.57 with CrCl ~ 24 ml/min CG  Goal of Therapy:  Heparin level  0.3-0.7 units/ml Monitor platelets by anticoagulation protocol: Yes   Plan:  Continue heparin infusion at 650 units/hr.  Daily heparin level & CBC while on heparin infusion.  Monitor closely for any further bleeding.  Noted Cardiology's plans for coronary angiography currently scheduled at 1030 tomorrow with orders to stop heparin on call to cath lab.   Lindell Spar, PharmD, BCPS Pager: [phone] 08/19/2015 8:43 PM

## 2015-08-19 NOTE — Progress Notes (Addendum)
Patient Name: Daniel Pacheco Date of Encounter: 08/19/2015  Principal Problem:   NSTEMI, initial episode of care [facility]) Active Problems:   SOB (shortness of breath)   Insulin dependent diabetes mellitus (HCC)   Essential hypertension   CKD (chronic kidney disease)   Normocytic anemia   H/O prostate cancer   Left shoulder pain   Bronchospasm   Respiratory distress   NSTEMI (non-ST elevated myocardial infarction) ([place])   Elevated troponin   Primary Cardiologist: Dr Percival Spanish here, has cards MD in [place]  Patient Profile: 75 yo male w/ hx prostate CA, IDDM, HTN, CKD, CAD (MI, 2012 w/ med mgt) was admitted 04/13 with chest pain, abnl troponin (peak 0.41). MV was abnl  SUBJECTIVE: No chest pain, no SOB. Feels well.   OBJECTIVE Filed Vitals:   08/18/15 1556 08/18/15 2131 08/19/15 0459 08/19/15 0500  BP: 165/75 143/77 125/77   Pulse: 83 96 83   Temp: 98.2 F (36.8 C) 98.1 F (36.7 C) 98.4 F (36.9 C)   TempSrc: Oral Oral Oral   Resp: 20 18 18    Height:      Weight:    167 lb 12.3 oz (76.1 kg)  SpO2: 100% 100% 98%     Intake/Output Summary (Last 24 hours) at 08/19/15 1148 Last data filed at 08/19/15 0459  Gross per 24 hour  Intake 748.75 ml  Output   1000 ml  Net -251.25 ml   Filed Weights   08/15/15 2047 08/18/15 0557 08/19/15 0500  Weight: 171 lb 8 oz (77.792 kg) 168 lb 3.2 oz (76.295 kg) 167 lb 12.3 oz (76.1 kg)    PHYSICAL EXAM General: Well developed, well nourished, male in no acute distress. Head: Normocephalic, atraumatic.  Neck: Supple without bruits, JVD not elevated. Lungs:  Resp regular and unlabored, CTA. Heart: RRR, S1, S2, no S3, S4, 2/6 murmur; no rub. Abdomen: Soft, non-tender, non-distended, BS + x 4.  Extremities: No clubbing, cyanosis, edema.  Neuro: Alert and oriented X 3. Moves all extremities spontaneously. Psych: Normal affect.  LABS: CBC: Recent Labs  08/18/15 0515 08/19/15 0431  WBC 6.7 6.4  HGB 11.9* 11.1*  HCT  35.5* 33.6*  MCV 94.7 96.8  PLT 171 [id number]   Basic Metabolic Panel: Recent Labs  08/17/15 0434 08/18/15 0515  NA 142 143  K 4.6 4.7  CL 105 104  CO2 27 28  GLUCOSE 107* 104*  BUN 37* 36*  CREATININE 2.77* 2.63*  CALCIUM 9.0 9.8  MG 1.7  --    Cardiac Enzymes: TROPONIN I  Date Value Ref Range Status  08/16/2015 0.34* <0.031 ng/mL Final  08/16/2015 0.32* <0.031 ng/mL Final  08/15/2015 0.41* <0.031 ng/mL Final   BNP:  B NATRIURETIC PEPTIDE  Date/Time Value Ref Range Status  08/18/2015 05:15 AM 81.6 0.0 - 100.0 pg/mL Final  08/15/2015 01:55 PM 112.7* 0.0 - 100.0 pg/mL Final   TELE:   SR, PVCs     ECHO: 04/14  - Left ventricle: The cavity size was normal. Wall thickness was  normal. Systolic function was normal. The estimated ejection  fraction was in the range of 60% to 65%. Wall motion was normal;  there were no regional wall motion abnormalities. Features are  consistent with a pseudonormal left ventricular filling pattern,  with concomitant abnormal relaxation and increased filling  pressure (grade 2 diastolic dysfunction). - Aortic valve: Moderately calcified annulus. There was mild  stenosis. Valve area (VTI): 1.35 cm^2. Valve area (Vmax): 1.25  cm^2. Valve area (  Vmean): 1.23 cm^2.  Radiology/Studies: Dg Chest 2 View 08/15/2015  CLINICAL DATA:  Shortness of breath with cough and mid chest pain 1 week. History of prostate cancer. EXAM: CHEST  2 VIEW COMPARISON:  None. FINDINGS: Lungs are somewhat hypoinflated without focal consolidation or effusion. Cardiomediastinal silhouette is within normal. There is calcified plaque over the aortic arch. Mild degenerate change of the spine and shoulders. IMPRESSION: No active cardiopulmonary disease. Electronically Signed   By: Marin Olp M.D.   On: 08/15/2015 14:22   Nm Myocar Multi W/spect W/wall Motion / Ef 08/17/2015  CLINICAL DATA:  Ex-smoker.  Diabetes.  Hypertension. EXAM: MYOCARDIAL IMAGING WITH SPECT (REST AND  PHARMACOLOGIC-STRESS) GATED LEFT VENTRICULAR WALL MOTION STUDY LEFT VENTRICULAR EJECTION FRACTION TECHNIQUE: Standard myocardial SPECT imaging was performed after resting intravenous injection of 10 mCi Tc-18m sestamibi. Subsequently, intravenous infusion of Lexiscan was performed under the supervision of the Cardiology staff. At peak effect of the drug, 30 mCi Tc-15m sestamibi was injected intravenously and standard myocardial SPECT imaging was performed. Quantitative gated imaging was also performed to evaluate left ventricular wall motion, and estimate left ventricular ejection fraction. COMPARISON:  Plain film of 08/15/2015 FINDINGS: Perfusion: Large rest defect involving the inferolateral wall, mid base. Areas of moderate reversibility involving the mid anterior lateral and inferior walls. Wall Motion: Global hypokinesis. Akinesis to dyskinesis involving the lateral wall. Left Ventricular Ejection Fraction: 35 % End diastolic volume 123456 ml End systolic volume 76 ml IMPRESSION: 1. Large rest defect involving the inferior lateral wall with areas of reversibility more anteriorly and inferiorly. 2. Global hypokinesis with dyskinesis involving the lateral wall. 3. Left ventricular ejection fraction 35% 4. High-risk stress test findings*. *2012 Appropriate Use Criteria for Coronary Revascularization Focused Update: J Am Coll Cardiol. [id number]. http://content.airportbarriers.com.aspx?articleid=[email or web address] These results will be called to the ordering clinician or representative by the Radiologist Assistant, and communication documented in the PACS or zVision Dashboard. Electronically Signed   By: Abigail Miyamoto M.D.   On: 08/17/2015 13:32   Nm Pulmonary Perf And Vent 08/15/2015  CLINICAL DATA:  Shortness of breath and cough for several days EXAM: NUCLEAR MEDICINE VENTILATION - PERFUSION LUNG SCAN Views: Anterior, posterior, left lateral, right lateral, RPO, LPO, RAO, LAO -ventilation and perfusion  RADIOPHARMACEUTICALS:  31.8 mCi Technetium-40m DTPA aerosol inhalation and 4.4 mCi Technetium-67m MAA IV COMPARISON:  Chest radiograph August 15, 2015 FINDINGS: Ventilation: There are patchy areas of decreased ventilation, primarily in the upper lobes, in a nonsegmental distribution. No well-defined segmental ventilation defects evident. Perfusion: There are scattered nonsegmental perfusion defects which essentially match the ventilation defects. There is no appreciable ventilation/perfusion mismatch. IMPRESSION: There are scattered matching nonsegmental ventilation and perfusion defects. There is no appreciable ventilation/ perfusion mismatch. These findings constitute an overall low probability of pulmonary embolus. Electronically Signed   By: Lowella Grip III M.D.   On: 08/15/2015 17:26      Current Medications:  . ALPRAZolam  0.5 mg Oral QHS  . aspirin EC  81 mg Oral Daily  . atorvastatin  80 mg Oral q1800  . gabapentin  600 mg Oral QHS  . insulin aspart  0-15 Units Subcutaneous TID WC  . insulin glargine  8 Units Subcutaneous QHS  . ipratropium-albuterol  3 mL Nebulization TID  . isosorbide mononitrate  60 mg Oral Daily  . metoprolol  50 mg Oral BID  . pantoprazole  80 mg Oral Daily   . sodium chloride 75 mL/hr at 08/18/15 2101  . heparin 650 Units/hr (08/19/15 0300)  ASSESSMENT AND PLAN: Principal Problem:   NSTEMI, initial episode of care [facility]) - see MV results above - it is abnormal - previous MI treated medically in [place] - on ASA, high-dose statin, Imdur 60 mg and Lopressor 50 mg bid. - with elevated BUN/Cr, may need to continue medical therapy - MD advise on cath  Otherwise, per IM Active Problems:   SOB (shortness of breath)   Insulin dependent diabetes mellitus (HCC)   Essential hypertension   CKD (chronic kidney disease)   Normocytic anemia   H/O prostate cancer   Left shoulder pain   Bronchospasm   Respiratory distress   NSTEMI (non-ST elevated myocardial  infarction) (HCC)   Elevated troponin   Signed, Barrett, Rhonda , PA-C 11:48 AM 08/19/2015 The patient has been seen in conjunction with Rosaria Ferries, PA-C. All aspects of care have been considered and discussed. The patient has been personally interviewed, examined, and all clinical data has been reviewed.   High-risk myocardial perfusion study with evidence of anterolateral ischemia and also inferolateral infarction. EF is reduced.  The patient was admitted with dyspnea and wheezing. He is an insulin-dependent diabetic.  Had catheterization proximally 5 years ago in [place]. Complicated by acute kidney injury. According to the patient angiography did not reveal anatomy that allowed intervention or surgery. He saw two cardiologists and 1 surgeon [place].  He feels better now than upon admission. He denies dyspnea.  Creatinine today is 2.6. The patient has had trace positive troponins since admission. ECGs demonstrate lateral and anterior T-wave inversion. Echo demonstrates normal function with EF greater than 60%.  Recommendation is for coronary angiography only using as little contrast as possible. Risk of acute kidney injury and possible dialysis was discussed with the patient, as high as 30-40% risk. I hope that angiography can be performed from the left radial and that less than 50 cc of contrast can be used. The patient was counseled to undergo left heart catheterization, coronary angiography, and possible percutaneous coronary intervention with stent implantation. The procedural risks and benefits were discussed in detail. The risks discussed included death, stroke, myocardial infarction, life-threatening bleeding, limb ischemia, allergy, and possible emergency cardiac surgery. The risk of these other significant complications were estimated to occur less than 1% of the time. After discussion, the patient has agreed to proceed.

## 2015-08-19 NOTE — Progress Notes (Signed)
ANTICOAGULATION CONSULT NOTE - Follow Up Consult  Pharmacy Consult for Heparin Indication: chest pain/ACS  Allergies  Allergen Reactions  . Nsaids     Pt has kidney problems, wants to avoid all nsaids  . Sulfa Antibiotics Rash    Patient Measurements: Height: 5\' 5"  (165.1 cm) Weight: 168 lb 3.2 oz (76.295 kg) IBW/kg (Calculated) : 61.5 Heparin Dosing Weight:   Vital Signs: Temp: 98.1 F (36.7 C) (04/16 2131) Temp Source: Oral (04/16 2131) BP: 143/77 mmHg (04/16 2131) Pulse Rate: 96 (04/16 2131)  Labs:  Recent Labs  08/16/15 [id number]  08/17/15 0434  08/18/15 0515 08/18/15 1611 08/18/15 2150  HGB  --   --  10.6*  --  11.9*  --   --   HCT  --   --  31.7*  --  35.5*  --   --   PLT  --   --  157  --  171  --   --   HEPARINUNFRC  --   < > 1.06*  < > 0.92* 0.38 0.26*  CREATININE  --   --  2.77*  --  2.63*  --   --   TROPONINI 0.34*  --   --   --   --   --   --   < > = values in this interval not displayed.  Estimated Creatinine Clearance: 23.5 mL/min (by C-G formula based on Cr of 2.63).   Medications:  Infusions:  . sodium chloride 75 mL/hr at 08/18/15 2101  . heparin 500 Units/hr (08/18/15 [id number])    Assessment: Patient with low heparin level.  No heparin issues per RN.   Goal of Therapy:  Heparin level 0.3-0.7 units/ml Monitor platelets by anticoagulation protocol: Yes   Plan:  Increase heparin to 650 units/hr Recheck level at [place] 08/19/2015,2:59 AM

## 2015-08-19 NOTE — Progress Notes (Signed)
ANTICOAGULATION CONSULT NOTE - Follow Up Consult  Pharmacy Consult for Heparin Indication: chest pain/ACS  Allergies  Allergen Reactions  . Nsaids     Pt has kidney problems, wants to avoid all nsaids  . Sulfa Antibiotics Rash    Patient Measurements: Height: 5\' 5"  (165.1 cm) Weight: 167 lb 12.3 oz (76.1 kg) IBW/kg (Calculated) : 61.5 Heparin Dosing Weight: 77 kg  Vital Signs: Temp: 98.4 F (36.9 C) (04/17 0459) Temp Source: Oral (04/17 0459) BP: 125/77 mmHg (04/17 0459) Pulse Rate: 78 (04/17 1305)  Labs:  Recent Labs  08/17/15 0434  08/18/15 0515 08/18/15 1611 08/18/15 2150 08/19/15 0431 08/19/15 1219  HGB 10.6*  --  11.9*  --   --  11.1*  --   HCT 31.7*  --  35.5*  --   --  33.6*  --   PLT 157  --  171  --   --  155  --   HEPARINUNFRC 1.06*  < > 0.92* 0.38 0.26*  --  0.40  CREATININE 2.77*  --  2.63*  --   --   --   --   < > = values in this interval not displayed.  Estimated Creatinine Clearance: 23.5 mL/min (by C-G formula based on Cr of 2.63).   Medications:  Infusions:  . sodium chloride 75 mL/hr at 08/18/15 2101  . heparin 650 Units/hr (08/19/15 0300)    Assessment: Patient is a 75 y.o M presented to the ED on 4/13 with c/o cough and wheezing.  VQ scan showed low probability for PE. He was found to have elevated troponin and heparin was started for r/o ACS.  He has hx of CAD- records from [place] has been unavailable thus far.   Cardiology has been consulted.  He had echo on 4/14 and stress test on 4/15.    Today, 08/19/2015: - heparin level therapeutic (0.40) this afternoon following rate increase this morning for subtherapeutic level - CBC stable.  No bleeding documented - CKI- hydrating patient for possible cardiac cath tomorrow (4/17) - Awaiting final decision re: plans for cardiac cath  Goal of Therapy:  Heparin level 0.3-0.7 units/ml Monitor platelets by anticoagulation protocol: Yes   Plan:  Continue Heparin 650 units/hr  Recheck 8hr  heparin level to confirm  Daily heparin level & CBC  F/U plans re: cardiac cath  Doreene Eland, PharmD, BCPS.   Pager: [phone] 08/19/2015 1:13 PM

## 2015-08-20 ENCOUNTER — Other Ambulatory Visit: Payer: Self-pay

## 2015-08-20 ENCOUNTER — Other Ambulatory Visit: Payer: Self-pay | Admitting: *Deleted

## 2015-08-20 ENCOUNTER — Encounter (HOSPITAL_COMMUNITY)
Admission: EM | Disposition: A | Discharge status: Home / Self Care | Payer: Self-pay | Source: Home / Self Care | Attending: Internal Medicine

## 2015-08-20 ENCOUNTER — Encounter (HOSPITAL_COMMUNITY): Payer: Self-pay | Admitting: Cardiovascular Disease

## 2015-08-20 DIAGNOSIS — I2510 Atherosclerotic heart disease of native coronary artery without angina pectoris: Secondary | ICD-10-CM

## 2015-08-20 DIAGNOSIS — N189 Chronic kidney disease, unspecified: Secondary | ICD-10-CM

## 2015-08-20 HISTORY — PX: CARDIAC CATHETERIZATION: SHX172

## 2015-08-20 LAB — GLUCOSE, CAPILLARY
GLUCOSE-CAPILLARY: 131 mg/dL — AB (ref 65–99)
GLUCOSE-CAPILLARY: 127 mg/dL — AB (ref 65–99)
GLUCOSE-CAPILLARY: 137 mg/dL — AB (ref 65–99)
Glucose-Capillary: 142 mg/dL — ABNORMAL HIGH (ref 65–99)
Glucose-Capillary: 112 mg/dL — ABNORMAL HIGH (ref 65–99)

## 2015-08-20 LAB — CBC
HEMATOCRIT: 32.2 % — AB (ref 39.0–52.0)
HEMOGLOBIN: 10.8 g/dL — AB (ref 13.0–17.0)
MCH: 32.3 pg (ref 26.0–34.0)
MCHC: 33.5 g/dL (ref 30.0–36.0)
MCV: 96.4 fL (ref 78.0–100.0)
Platelets: 135 10*3/uL — ABNORMAL LOW (ref 150–400)
RBC: 3.34 MIL/uL — ABNORMAL LOW (ref 4.22–5.81)
RDW: 14.8 % (ref 11.5–15.5)
WBC: 5.8 10*3/uL (ref 4.0–10.5)

## 2015-08-20 LAB — BASIC METABOLIC PANEL
Anion gap: 10 (ref 5–15)
BUN: 35 mg/dL — AB (ref 6–20)
CO2: 24 mmol/L (ref 22–32)
Calcium: 8.9 mg/dL (ref 8.9–10.3)
Chloride: 108 mmol/L (ref 101–111)
Creatinine, Ser: 2.38 mg/dL — ABNORMAL HIGH (ref 0.61–1.24)
GFR calc non Af Amer: 25 mL/min — ABNORMAL LOW (ref >60)
GFR, EST AFRICAN AMERICAN: 29 mL/min — AB (ref >60)
Glucose, Bld: 153 mg/dL — ABNORMAL HIGH (ref 65–99)
POTASSIUM: 5 mmol/L (ref 3.5–5.1)
SODIUM: 142 mmol/L (ref 135–145)

## 2015-08-20 LAB — HEPARIN LEVEL (UNFRACTIONATED): Heparin Unfractionated: 0.24 IU/mL — ABNORMAL LOW (ref 0.30–0.70)

## 2015-08-20 SURGERY — LEFT HEART CATH AND CORONARY ANGIOGRAPHY

## 2015-08-20 MED ORDER — ONDANSETRON HCL 4 MG/2ML IJ SOLN: 4.0000 mg | Freq: Four times a day (QID) | INTRAMUSCULAR | Status: DC | PRN

## 2015-08-20 MED ORDER — DIAZEPAM 5 MG PO TABS: 5.0000 mg | ORAL_TABLET | ORAL | Status: DC | PRN

## 2015-08-20 MED ORDER — SODIUM CHLORIDE 0.9 % IV SOLN: 250.0000 mL | INTRAVENOUS | Status: DC | PRN

## 2015-08-20 MED ORDER — ASPIRIN EC 81 MG PO TBEC: 81.0000 mg | DELAYED_RELEASE_TABLET | Freq: Every day | ORAL | Status: DC

## 2015-08-20 MED ORDER — HEPARIN (PORCINE) IN NACL 2-0.9 UNIT/ML-% IJ SOLN
INTRAMUSCULAR | Status: DC | PRN
  Administered 2015-08-20: 1500 mL

## 2015-08-20 MED ORDER — SODIUM CHLORIDE 0.9 % IV SOLN: INTRAVENOUS | Status: DC

## 2015-08-20 MED ORDER — ACETAMINOPHEN 325 MG PO TABS: 650.0000 mg | ORAL_TABLET | ORAL | Status: DC | PRN

## 2015-08-20 MED ORDER — MIDAZOLAM HCL 2 MG/2ML IJ SOLN
INTRAMUSCULAR | Status: DC | PRN
  Administered 2015-08-20: 2 mg via INTRAVENOUS

## 2015-08-20 MED ORDER — SODIUM CHLORIDE 0.9% FLUSH
3.0000 mL | Freq: Two times a day (BID) | INTRAVENOUS | Status: DC
  Administered 2015-08-20: 3 mL via INTRAVENOUS

## 2015-08-20 MED ORDER — INSULIN GLARGINE 100 UNIT/ML ~~LOC~~ SOLN
10.0000 [IU] | Freq: Every day | SUBCUTANEOUS | Status: DC
  Administered 2015-08-20 – 2015-08-21 (×2): 10 [IU] via SUBCUTANEOUS
  Filled 2015-08-20 (×3): qty 0.1

## 2015-08-20 MED ORDER — MIDAZOLAM HCL 2 MG/2ML IJ SOLN
INTRAMUSCULAR | Status: AC
  Filled 2015-08-20: qty 2

## 2015-08-20 MED ORDER — PIPERACILLIN-TAZOBACTAM 3.375 G IVPB: 3.3750 g | Freq: Three times a day (TID) | INTRAVENOUS | Status: DC

## 2015-08-20 MED ORDER — HEPARIN (PORCINE) IN NACL 2-0.9 UNIT/ML-% IJ SOLN
INTRAMUSCULAR | Status: AC
  Filled 2015-08-20: qty 1000

## 2015-08-20 MED ORDER — IOPAMIDOL (ISOVUE-370) INJECTION 76%
INTRAVENOUS | Status: AC
  Filled 2015-08-20: qty 100

## 2015-08-20 MED ORDER — LIDOCAINE HCL (PF) 1 % IJ SOLN
INTRAMUSCULAR | Status: DC | PRN
  Administered 2015-08-20: 25 mL

## 2015-08-20 MED ORDER — LIDOCAINE HCL (PF) 1 % IJ SOLN
INTRAMUSCULAR | Status: AC
  Filled 2015-08-20: qty 30

## 2015-08-20 MED ORDER — FENTANYL CITRATE (PF) 100 MCG/2ML IJ SOLN
INTRAMUSCULAR | Status: DC | PRN
  Administered 2015-08-20: 50 ug via INTRAVENOUS

## 2015-08-20 MED ORDER — IOPAMIDOL (ISOVUE-370) INJECTION 76%
INTRAVENOUS | Status: DC | PRN
  Administered 2015-08-20: 30 mL via INTRA_ARTERIAL

## 2015-08-20 MED ORDER — FENTANYL CITRATE (PF) 100 MCG/2ML IJ SOLN
INTRAMUSCULAR | Status: AC
  Filled 2015-08-20: qty 2

## 2015-08-20 MED ORDER — SODIUM CHLORIDE 0.9% FLUSH: 3.0000 mL | INTRAVENOUS | Status: DC | PRN

## 2015-08-20 MED ORDER — HEPARIN (PORCINE) IN NACL 100-0.45 UNIT/ML-% IJ SOLN
900.0000 [IU]/h | INTRAMUSCULAR | Status: DC
  Administered 2015-08-20: 750 [IU]/h via INTRAVENOUS
  Administered 2015-08-21: 900 [IU]/h via INTRAVENOUS
  Filled 2015-08-20 (×2): qty 250

## 2015-08-20 SURGICAL SUPPLY — 6 items
CATH INFINITI MULTIPACK ST 5F (CATHETERS) ×3 IMPLANT
KIT HEART LEFT (KITS) ×3 IMPLANT
PACK CARDIAC CATHETERIZATION (CUSTOM PROCEDURE TRAY) ×3 IMPLANT
SHEATH PINNACLE 5F 10CM (SHEATH) ×3 IMPLANT
TRANSDUCER W/STOPCOCK (MISCELLANEOUS) ×3 IMPLANT
WIRE EMERALD 3MM-J .035X150CM (WIRE) ×3 IMPLANT

## 2015-08-20 NOTE — Progress Notes (Signed)
   Creat is 2.38 this AM which is back to baseline.  Cath is scheduled for 10:30 AM.

## 2015-08-20 NOTE — Interval H&P Note (Signed)
History and Physical Interval Note:  08/20/2015 9:50 AM  Daniel Pacheco  has presented today for surgery, with the diagnosis of cad/non stemi  The various methods of treatment have been discussed with the patient and family. After consideration of risks, benefits and other options for treatment, the patient has consented to  Procedure(s): Coronary Only (N/A) as a surgical intervention .  The patient's history has been reviewed, patient examined, no change in status, stable for surgery.  I have reviewed the patient's chart and labs.  Questions were answered to the patient's satisfaction.     Loucinda Croy A

## 2015-08-20 NOTE — Progress Notes (Signed)
ANTICOAGULATION CONSULT NOTE - Follow Up Consult  Pharmacy Consult for Heparin Indication: chest pain/ACS  Allergies  Allergen Reactions  . Nsaids     Pt has kidney problems, wants to avoid all nsaids  . Sulfa Antibiotics Rash    Patient Measurements: Height: 5\' 5"  (165.1 cm) Weight: 171 lb 8 oz (77.792 kg) IBW/kg (Calculated) : 61.5 Heparin Dosing Weight:   Vital Signs: Temp: 98.4 F (36.9 C) (04/18 0446) Temp Source: Oral (04/18 0446) BP: 147/79 mmHg (04/18 0446) Pulse Rate: 87 (04/18 0446)  Labs:  Recent Labs  08/18/15 0515  08/19/15 0431 08/19/15 1219 08/19/15 1637 08/19/15 1953 08/20/15 0449  HGB 11.9*  --  11.1*  --   --   --  10.8*  HCT 35.5*  --  33.6*  --   --   --  32.2*  PLT 171  --  155  --   --   --  135*  HEPARINUNFRC 0.92*  < >  --  0.40  --  0.33 0.24*  CREATININE 2.63*  --   --   --  2.57*  --  2.38*  < > = values in this interval not displayed.  Estimated Creatinine Clearance: 26.2 mL/min (by C-G formula based on Cr of 2.38).   Medications:  Infusions:  . sodium chloride 75 mL/hr at 08/18/15 2101  . sodium chloride 150 mL/hr at 08/20/15 0158  . heparin 650 Units/hr (08/19/15 2044)    Assessment: Patient with low heparin level.  No heparin issues per RN.  Note scheduled Cath today.  Goal of Therapy:  Heparin level 0.3-0.7 units/ml Monitor platelets by anticoagulation protocol: Yes   Plan:  Increase heparin to 750 units/hr Recheck level at 1400, may have to d/c or move based on cath.  Tyler Deis, Shea Stakes [place]ford 08/20/2015,5:45 AM

## 2015-08-20 NOTE — Care Management Important Message (Signed)
Important Message  Patient Details  Name: Daniel Pacheco MRN: [id number] Date of Birth: [birth year]   Medicare Important Message Given:  Yes    Camillo Flaming 08/20/2015, 9:22 AMImportant Message  Patient Details  Name: Daniel Pacheco MRN: [id number] Date of Birth: [birth year]   Medicare Important Message Given:  Yes    Camillo Flaming 08/20/2015, 9:22 AM

## 2015-08-20 NOTE — H&P (View-Only) (Signed)
   Creat is 2.38 this AM which is back to baseline.  Cath is scheduled for 10:30 AM.

## 2015-08-20 NOTE — Progress Notes (Addendum)
PROGRESS NOTE  Daniel Pacheco [id number] DOB: [birth year] DOA: 08/15/2015 PCP: No primary care provider on file.  Brief History 75 y/o male with history of DM2, HTN, CKD, CAD with hx of MI presented with one week hx of sob, cough and wheezing. He is in [place] visiting his daughter from [place]. He is mostly complaining of dyspnea on exertion, although he states he has had some chest discomfort with walking up the stairs at his daughter's house. He denies any f/c or sick contacts. He denies any dizziness, N/V or syncope. He is a former smoker x 83yrs but quit over 30 years ago. The patient used his daughter's nebulizer without appreciable relief of his symptoms.  He was found to have elevated troponin and d-dimer. VQ scan was low probability.He was noted to have an elevated troponin of 0.50. Because of his presentation and abnormal EKG,the patient was started on heparin drip and admitted for further evaluation. Cardiology was consulted. The patient had a high risk Myoview. Heart catheterization on 08/20/2015 showed severe multivessel disease.  The patient was kept at [facility] for CABG.   Assessment/Plan: abnormal EKG/elevated troponin/CAD history/Angina -Concern about anginal equivalent presenting with sob and wheeze -pt did endorse some exertional chest discomfort this past week -Appreciate cardiology -heparin drip 4/13-->4/18 -Echo--EF 60-65%, grade 2 DD -08/17/2015 Myoview--reversible defects in the inferior lateral walls global HK the lateral walls, EF 35%; high risk study-->>continue heparin for now -continue ASA and Imdur -heart cath 08/20/15--severe multivessel disease-->Cards contacting TCTS for CABG -continue Lipitor 80mg  daily and metoprolol  Dyspnea with exertion/wheezing/bronchospasm -likely angina equivalent presenting as wheezing/bronchospasm -hold steroids for now  -continue bronchodilators -CXR without acute abnormalities -stable on RA  CKD unknown  baseline -serum creatinine 2.49 at time of admission -trend--overall stable  DM2 -check A1C--6.9 -continue reduced dose Lantus-->reduce dose more due to anticipated npo for heart cath 4/18 -4/18 pm-->Increase Lantus to 10 units now he is back on diet  HTN -continue metoprolol and imdur  Anemia -B12--582 -iron sat 15% -plan to eventually start po iron  HLD -continue statin--ncreased to 80 mg daily  Family communication--wife updated at bedside 08/19/15 Dispo--planning CABG     Procedures/Studies: Dg Chest 2 View  08/15/2015  CLINICAL DATA:  Shortness of breath with cough and mid chest pain 1 week. History of prostate cancer. EXAM: CHEST  2 VIEW COMPARISON:  None. FINDINGS: Lungs are somewhat hypoinflated without focal consolidation or effusion. Cardiomediastinal silhouette is within normal. There is calcified plaque over the aortic arch. Mild degenerate change of the spine and shoulders. IMPRESSION: No active cardiopulmonary disease. Electronically Signed   By: Marin Olp M.D.   On: 08/15/2015 14:22   Nm Myocar Multi W/spect W/wall Motion / Ef  08/17/2015  CLINICAL DATA:  Ex-smoker.  Diabetes.  Hypertension. EXAM: MYOCARDIAL IMAGING WITH SPECT (REST AND PHARMACOLOGIC-STRESS) GATED LEFT VENTRICULAR WALL MOTION STUDY LEFT VENTRICULAR EJECTION FRACTION TECHNIQUE: Standard myocardial SPECT imaging was performed after resting intravenous injection of 10 mCi Tc-27m sestamibi. Subsequently, intravenous infusion of Lexiscan was performed under the supervision of the Cardiology staff. At peak effect of the drug, 30 mCi Tc-31m sestamibi was injected intravenously and standard myocardial SPECT imaging was performed. Quantitative gated imaging was also performed to evaluate left ventricular wall motion, and estimate left ventricular ejection fraction. COMPARISON:  Plain film of 08/15/2015 FINDINGS: Perfusion: Large rest defect involving the inferolateral wall, mid base. Areas of moderate  reversibility involving the mid anterior lateral and inferior  walls. Wall Motion: Global hypokinesis. Akinesis to dyskinesis involving the lateral wall. Left Ventricular Ejection Fraction: 35 % End diastolic volume 123456 ml End systolic volume 76 ml IMPRESSION: 1. Large rest defect involving the inferior lateral wall with areas of reversibility more anteriorly and inferiorly. 2. Global hypokinesis with dyskinesis involving the lateral wall. 3. Left ventricular ejection fraction 35% 4. High-risk stress test findings*. *2012 Appropriate Use Criteria for Coronary Revascularization Focused Update: J Am Coll Cardiol. [id number]. http://content.[email or web address].aspx?articleid=[email or web address] These results will be called to the ordering clinician or representative by the Radiologist Assistant, and communication documented in the PACS or zVision Dashboard. Electronically Signed   By: Abigail Miyamoto M.D.   On: 08/17/2015 13:32   Nm Pulmonary Perf And Vent  08/15/2015  CLINICAL DATA:  Shortness of breath and cough for several days EXAM: NUCLEAR MEDICINE VENTILATION - PERFUSION LUNG SCAN Views: Anterior, posterior, left lateral, right lateral, RPO, LPO, RAO, LAO -ventilation and perfusion RADIOPHARMACEUTICALS:  31.8 mCi Technetium-66m DTPA aerosol inhalation and 4.4 mCi Technetium-33m MAA IV COMPARISON:  Chest radiograph August 15, 2015 FINDINGS: Ventilation: There are patchy areas of decreased ventilation, primarily in the upper lobes, in a nonsegmental distribution. No well-defined segmental ventilation defects evident. Perfusion: There are scattered nonsegmental perfusion defects which essentially match the ventilation defects. There is no appreciable ventilation/perfusion mismatch. IMPRESSION: There are scattered matching nonsegmental ventilation and perfusion defects. There is no appreciable ventilation/ perfusion mismatch. These findings constitute an overall low probability of pulmonary embolus. Electronically  Signed   By: Lowella Grip III M.D.   On: 08/15/2015 17:26         Subjective: Patient denied any chest pain or shortness of breath. No abdominal pain. Denies any dizziness or syncope.  Objective: Filed Vitals:   08/20/15 1230 08/20/15 1300 08/20/15 1330 08/20/15 1400  BP: 136/83 141/87 141/84 138/79  Pulse: 79 72 71 76  Temp:      TempSrc:      Resp:      Height:      Weight:      SpO2:        Intake/Output Summary (Last 24 hours) at 08/20/15 1559 Last data filed at 08/20/15 1512  Gross per 24 hour  Intake 5214.36 ml  Output    250 ml  Net 4964.36 ml   Weight change: 1.692 kg (3 lb 11.7 oz) Exam:   General:  Pt is alert, follows commands appropriately, not in acute distress  HEENT: No icterus, No thrush, No neck mass, [place]/AT  Cardiovascular: RRR, S1/S2, no rubs, no gallops  Respiratory: Fine bibasilar wheeze.  Abdomen: Soft/+BS, non tender, non distended, no guarding   Data Reviewed: Basic Metabolic Panel:  Recent Labs Lab 08/16/15 0234 08/17/15 0434 08/18/15 0515 08/19/15 1637 08/20/15 0449  NA 146* 142 143 141 142  K 4.4 4.6 4.7 4.9 5.0  CL 108 105 104 107 108  CO2 28 27 28 26 24   GLUCOSE 83 107* 104* 192* 153*  BUN 38* 37* 36* 38* 35*  CREATININE 2.37* 2.77* 2.63* 2.57* 2.38*  CALCIUM 9.5 9.0 9.8 9.3 8.9  MG  --  1.7  --   --   --    Liver Function Tests: No results for input(s): AST, ALT, ALKPHOS, BILITOT, PROT, ALBUMIN in the last 168 hours. No results for input(s): LIPASE, AMYLASE in the last 168 hours. No results for input(s): AMMONIA in the last 168 hours. CBC:  Recent Labs Lab 08/15/15 1353  08/16/15 0234 08/17/15 0434 08/18/15 0515 08/19/15 0431  08/20/15 0449  WBC 6.4  --  7.3 6.2 6.7 6.4 5.8  NEUTROABS 3.5  --   --   --   --   --   --   HGB 11.9*  --  11.9* 10.6* 11.9* 11.1* 10.8*  HCT 35.8*  < > 35.5* 31.7* 35.5* 33.6* 32.2*  MCV 96.5  --  96.7 97.2 94.7 96.8 96.4  PLT 185  --  177 157 171 155 135*  < > = values in  this interval not displayed. Cardiac Enzymes:  Recent Labs Lab 08/15/15 1942 08/16/15 0055 08/16/15 0633  TROPONINI 0.41* 0.32* 0.34*   BNP: Invalid input(s): POCBNP CBG:  Recent Labs Lab 08/19/15 1707 08/19/15 2040 08/20/15 0757 08/20/15 1054 08/20/15 1237  GLUCAP 162* 136* 131* 142* 112*    No results found for this or any previous visit (from the past 240 hour(s)).   Scheduled Meds: . ALPRAZolam  0.5 mg Oral QHS  . aspirin EC  81 mg Oral Daily  . atorvastatin  80 mg Oral q1800  . gabapentin  600 mg Oral QHS  . insulin aspart  0-15 Units Subcutaneous TID WC  . ipratropium-albuterol  3 mL Nebulization TID  . isosorbide mononitrate  60 mg Oral Daily  . metoprolol  50 mg Oral BID  . pantoprazole  80 mg Oral Daily  . sodium chloride flush  3 mL Intravenous Q12H   Continuous Infusions: . sodium chloride 75 mL/hr at 08/18/15 2101  . sodium chloride 150 mL/hr (08/20/15 1048)  . heparin       Killian Schwer, DO  [facility] Pager [phone]  If 7PM-7AM, please contact night-coverage [email or web address] Password [email or web address] 08/20/2015, 3:59 PM   LOS: 4 days

## 2015-08-20 NOTE — Progress Notes (Signed)
ANTICOAGULATION CONSULT NOTE - Follow Up Consult  Pharmacy Consult for Heparin Indication: chest pain/ACS  Allergies  Allergen Reactions  . Nsaids     Pt has kidney problems, wants to avoid all nsaids  . Sulfa Antibiotics Rash    Patient Measurements: Height: 5\' 5"  (165.1 cm) Weight: 171 lb 8 oz (77.792 kg) IBW/kg (Calculated) : 61.5 Heparin Dosing Weight: 77 kg  Vital Signs: Temp: 98.4 F (36.9 C) (04/18 0446) Temp Source: Oral (04/18 0446) BP: 144/82 mmHg (04/18 1110) Pulse Rate: 79 (04/18 1110)  Labs:  Recent Labs  08/18/15 0515  08/19/15 0431 08/19/15 1219 08/19/15 1637 08/19/15 1953 08/20/15 0449  HGB 11.9*  --  11.1*  --   --   --  10.8*  HCT 35.5*  --  33.6*  --   --   --  32.2*  PLT 171  --  155  --   --   --  135*  HEPARINUNFRC 0.92*  < >  --  0.40  --  0.33 0.24*  CREATININE 2.63*  --   --   --  2.57*  --  2.38*  < > = values in this interval not displayed.  Estimated Creatinine Clearance: 26.2 mL/min (by C-G formula based on Cr of 2.38).   Assessment: Patient is a 75 y.o M [facility] with c/o cough and wheezing.  VQ scan showed low probability for PE. He was found to have elevated troponin and heparin was started for r/o ACS.  Transferred from [facility] to [facility] 4/18 for cardiac cath which showed severe multivessel CAD - to get TCTS consult for CABG.  Pharmacy consulted to resume heparin 6 hrs post sheath pull with no bolus. Sheath out 1107- site level 0.  AM HL 0.24 on 650 units/hr.  Hg 10.8, pltc low 135K.    Goal of Therapy:  Heparin level 0.3-0.7 units/ml Monitor platelets by anticoagulation protocol: Yes   Plan:  Resume heparin 6 hrs s/p sheath pull at 1700 at rate of 750 units/hr and check HL in 8 hrs Daily HL/CBC while on heparin  Eudelia Bunch, Pharm.D. BP:[id number] 08/20/2015 11:56 AM

## 2015-08-20 NOTE — Progress Notes (Signed)
Site area: Right groin a 5 french arterial sheath was removed  Site Prior to Removal:  Level 0  Pressure Applied For 15 MINUTES    Bedrest Beginning at   1110a  Manual:   Yes.    Patient Status During Pull:  stable  Post Pull Groin Site:  Level 0  Post Pull Instructions Given:  Yes.    Post Pull Pulses Present:  Yes.    Dressing Applied:  Yes.    Comments:  VS rermain stable during sheath pull

## 2015-08-21 ENCOUNTER — Other Ambulatory Visit (HOSPITAL_COMMUNITY): Payer: Medicare (Managed Care)

## 2015-08-21 ENCOUNTER — Encounter (HOSPITAL_COMMUNITY): Payer: Medicare (Managed Care)

## 2015-08-21 ENCOUNTER — Inpatient Hospital Stay (HOSPITAL_COMMUNITY): Payer: Medicare (Managed Care)

## 2015-08-21 DIAGNOSIS — N183 Chronic kidney disease, stage 3 (moderate): Secondary | ICD-10-CM

## 2015-08-21 DIAGNOSIS — I2510 Atherosclerotic heart disease of native coronary artery without angina pectoris: Secondary | ICD-10-CM | POA: Insufficient documentation

## 2015-08-21 DIAGNOSIS — I25119 Atherosclerotic heart disease of native coronary artery with unspecified angina pectoris: Secondary | ICD-10-CM

## 2015-08-21 LAB — BASIC METABOLIC PANEL
ANION GAP: 11 (ref 5–15)
BUN: 27 mg/dL — ABNORMAL HIGH (ref 6–20)
CALCIUM: 9.1 mg/dL (ref 8.9–10.3)
CO2: 20 mmol/L — AB (ref 22–32)
Chloride: 109 mmol/L (ref 101–111)
Creatinine, Ser: 2.14 mg/dL — ABNORMAL HIGH (ref 0.61–1.24)
GFR calc Af Amer: 33 mL/min — ABNORMAL LOW (ref >60)
GFR calc non Af Amer: 29 mL/min — ABNORMAL LOW (ref >60)
GLUCOSE: 112 mg/dL — AB (ref 65–99)
Potassium: 4.4 mmol/L (ref 3.5–5.1)
Sodium: 140 mmol/L (ref 135–145)

## 2015-08-21 LAB — SURGICAL PCR SCREEN
MRSA, PCR: NEGATIVE
Staphylococcus aureus: NEGATIVE

## 2015-08-21 LAB — URINE MICROSCOPIC-ADD ON

## 2015-08-21 LAB — URINALYSIS, ROUTINE W REFLEX MICROSCOPIC
Bilirubin Urine: NEGATIVE
Glucose, UA: NEGATIVE mg/dL
Ketones, ur: NEGATIVE mg/dL
Leukocytes, UA: NEGATIVE
Nitrite: NEGATIVE
Protein, ur: 30 mg/dL — AB
Specific Gravity, Urine: 1.013 (ref 1.005–1.030)
pH: 6 (ref 5.0–8.0)

## 2015-08-21 LAB — CBC
HEMATOCRIT: 32.5 % — AB (ref 39.0–52.0)
Hemoglobin: 10.8 g/dL — ABNORMAL LOW (ref 13.0–17.0)
MCH: 32.2 pg (ref 26.0–34.0)
MCHC: 33.2 g/dL (ref 30.0–36.0)
MCV: 97 fL (ref 78.0–100.0)
Platelets: 128 10*3/uL — ABNORMAL LOW (ref 150–400)
RBC: 3.35 MIL/uL — ABNORMAL LOW (ref 4.22–5.81)
RDW: 14.8 % (ref 11.5–15.5)
WBC: 6.3 10*3/uL (ref 4.0–10.5)

## 2015-08-21 LAB — PROTIME-INR
INR: 1.08 (ref 0.00–1.49)
Prothrombin Time: 14.2 seconds (ref 11.6–15.2)

## 2015-08-21 LAB — APTT: aPTT: 109 seconds — ABNORMAL HIGH (ref 24–37)

## 2015-08-21 LAB — GLUCOSE, CAPILLARY
GLUCOSE-CAPILLARY: 109 mg/dL — AB (ref 65–99)
GLUCOSE-CAPILLARY: 137 mg/dL — AB (ref 65–99)
GLUCOSE-CAPILLARY: 117 mg/dL — AB (ref 65–99)
Glucose-Capillary: 137 mg/dL — ABNORMAL HIGH (ref 65–99)

## 2015-08-21 LAB — HEPARIN LEVEL (UNFRACTIONATED)
HEPARIN UNFRACTIONATED: 0.49 [IU]/mL (ref 0.30–0.70)
Heparin Unfractionated: 0.18 IU/mL — ABNORMAL LOW (ref 0.30–0.70)

## 2015-08-21 MED ORDER — FERROUS SULFATE 325 (65 FE) MG PO TABS: 325.0000 mg | ORAL_TABLET | Freq: Two times a day (BID) | ORAL | Status: DC

## 2015-08-21 MED ORDER — IPRATROPIUM-ALBUTEROL 0.5-2.5 (3) MG/3ML IN SOLN: 3.0000 mL | Freq: Four times a day (QID) | RESPIRATORY_TRACT | Status: DC | PRN

## 2015-08-21 MED ORDER — SODIUM CHLORIDE 0.9 % IV SOLN
INTRAVENOUS | Status: DC
  Administered 2015-08-21 (×2): via INTRAVENOUS

## 2015-08-21 NOTE — Progress Notes (Addendum)
       Patient Name: Daniel Pacheco Date of Encounter: 08/21/2015    SUBJECTIVE:  Per internal medicine, some diarrhea earlier this morning. Denies chest discomfort and dyspnea. Wants conversation with family is spanish to allow them to understand process and expected recovery.  TELEMETRY:  Normal sinus rhythm Filed Vitals:   08/20/15 1330 08/20/15 1400 08/20/15 2059 08/21/15 0525  BP: 141/84 138/79 159/86 156/83  Pulse: 71 76 80 80  Temp:   98.2 F (36.8 C) 98 F (36.7 C)  TempSrc:   Oral Oral  Resp:   18 18  Height:      Weight:    171 lb 1.6 oz (77.61 kg)  SpO2:   98% 99%    Intake/Output Summary (Last 24 hours) at 08/21/15 1034 Last data filed at 08/21/15 0842  Gross per 24 hour  Intake 4583.79 ml  Output    325 ml  Net 4258.79 ml   LABS: Basic Metabolic Panel:  Recent Labs  08/20/15 0449 08/21/15 0737  NA 142 140  K 5.0 4.4  CL 108 109  CO2 24 20*  GLUCOSE 153* 112*  BUN 35* 27*  CREATININE 2.38* 2.14*  CALCIUM 8.9 9.1   CBC:  Recent Labs  08/20/15 0449 08/21/15 0737  WBC 5.8 6.3  HGB 10.8* 10.8*  HCT 32.2* 32.5*  MCV 96.4 97.0  PLT 135* 128*     Radiology/Studies:  No new data  Physical Exam: Blood pressure 156/83, pulse 80, temperature 98 F (36.7 C), temperature source Oral, resp. rate 18, height 5\' 5"  (1.651 m), weight 171 lb 1.6 oz (77.61 kg), SpO2 99 %. Weight change: -6.4 oz (-0.181 kg)  Wt Readings from Last 3 Encounters:  08/21/15 171 lb 1.6 oz (77.61 kg)    Right femoral cath site is unremarkable  ASSESSMENT:  1. 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 2. LV function is normal by echocardiography 3. Chronic kidney disease stage III,without apparent acute injury related to minimal contrast exposure on yesterday.  Plan:  1. TCTS consultation for revascularization -  Seen by PVT this AM. 2. Monitor kidney function. Continue IV hydration. 3. Will arrange interpreter and discuss recovery from surgery and other questions they may have.  Demetrios Isaacs 08/21/2015, 10:34 AM

## 2015-08-21 NOTE — Consult Note (Signed)
[address]       [place] [address]             [phone]        Abdulmalik Silva-Pabon [place] Medical Record [id number] Date of Birth: 1940-11-06  Referring: Primary Care: Nona Dell M.D.  No primary care provider on file.  Chief Complaint:    Chief Complaint  Patient presents with  . Cough  . Wheezing   patient examined, echocardiogram and cardiac catheterization reviewed    History of Present Illness:     75 year old Hispanic male results with shortness of breath and wheezing and positive cardiac enzymes. He is a former smoker but quit 30 years ago. He had nonspecific EKG changes in the emergency department and positive troponin. His creatinine was elevated so he had a VQ scan which was low probability for PE. The patient was admitted and placed on IV heparin for acute coronary syndrome and remained hemodynamically stable. Echocardiogram showed normal LV systolic function with LVH. When his creatinine improved he underwent left heart cath by Dr. Georgina Peer which showed severe three-vessel coronary disease in a diabetic type pattern. He was recommended for surgical CABG.   Current Activity/ Functional Status: The patient resides in [place] and and he is here visiting his daughter since February 2017. He has normal ADLs.   Zubrod Score: At the time of surgery this patient's most appropriate activity status/level should be described as: []     0    Normal activity, no symptoms []     1    Restricted in physical strenuous activity but ambulatory, able to do out light work [x]     2    Ambulatory and capable of self care, unable to do work activities, up and about                 more than 50%  Of the time                            []     3    Only limited self care, in bed greater than 50% of waking hours []     4    Completely disabled, no self care, confined to bed or chair []     5    Moribund  Past Medical History  Diagnosis Date  . Hypertension   .  Diabetes mellitus without complication ([place])   . Cancer [facility])     Past Surgical History  Procedure Laterality Date  . Cardiac catheterization N/A 08/20/2015    Procedure: Left Heart Cath and Coronary Angiography;  Surgeon: Troy Sine, MD;  Location: [facility];  Service: Cardiovascular;  Laterality: N/A;    History  Smoking status  . Former Smoker  Smokeless tobacco  . Not on file    History  Alcohol Use No    Social History   Social History  . Marital Status: Married    Spouse Name: N/A  . Number of Children: N/A  . Years of Education: N/A   Occupational History  . Not on file.   Social History Main Topics  . Smoking status: Former Research scientist (life sciences)  . Smokeless tobacco: Not on file  . Alcohol Use: No  . Drug Use: Not on file  . Sexual Activity: Not on file   Other Topics Concern  . Not on file   Social History Narrative    Allergies  Allergen Reactions  . Nsaids     Pt has kidney problems, wants to avoid all nsaids  . Sulfa Antibiotics Rash    Current Facility-Administered Medications  Medication Dose Route Frequency Provider Last Rate Last Dose  . 0.9 %  sodium chloride infusion   Intravenous Continuous Fay Records, MD 75 mL/hr at 08/20/15 1809    . 0.9 %  sodium chloride infusion   Intravenous Continuous Belva Crome, MD 50 mL/hr at 08/21/15 1127    . acetaminophen (TYLENOL) tablet 650 mg  650 mg Oral Q4H PRN Troy Sine, MD      . ALPRAZolam Duanne Moron) tablet 0.25 mg  0.25 mg Oral BID PRN Vianne Bulls, MD      . ALPRAZolam Duanne Moron) tablet 0.5 mg  0.5 mg Oral QHS Vianne Bulls, MD   0.5 mg at 08/20/15 2236  . aspirin EC tablet 81 mg  81 mg Oral Daily Vianne Bulls, MD   81 mg at 08/21/15 1127  . atorvastatin (LIPITOR) tablet 80 mg  80 mg Oral q1800 Eileen Stanford, PA-C   80 mg at 08/21/15 1732  . diazepam (VALIUM) tablet 5 mg  5 mg Oral Q4H PRN Troy Sine, MD      . gabapentin (NEURONTIN) capsule 600 mg  600 mg Oral QHS Orson Eva, MD   600  mg at 08/20/15 2236  . guaiFENesin-dextromethorphan (ROBITUSSIN DM) 100-10 MG/5ML syrup 5 mL  5 mL Oral Q4H PRN Dianne Dun, NP   5 mL at 08/20/15 2236  . heparin ADULT infusion 100 units/mL (25000 units/250 mL)  900 Units/hr Intravenous Continuous Franky Macho, RPH 9 mL/hr at 08/21/15 0335 900 Units/hr at 08/21/15 0335  . insulin aspart (novoLOG) injection 0-15 Units  0-15 Units Subcutaneous TID WC Vianne Bulls, MD   2 Units at 08/21/15 1732  . insulin glargine (LANTUS) injection 10 Units  10 Units Subcutaneous QHS Orson Eva, MD   10 Units at 08/20/15 2236  . ipratropium-albuterol (DUONEB) 0.5-2.5 (3) MG/3ML nebulizer solution 3 mL  3 mL Nebulization Q6H PRN Thurnell Lose, MD      . isosorbide mononitrate (IMDUR) 24 hr tablet 60 mg  60 mg Oral Daily Orson Eva, MD   60 mg at 08/21/15 1127  . metoprolol tartrate (LOPRESSOR) tablet 50 mg  50 mg Oral BID Vianne Bulls, MD   50 mg at 08/21/15 1127  . nitroGLYCERIN (NITROSTAT) SL tablet 0.4 mg  0.4 mg Sublingual Q5 Min x 3 PRN Vianne Bulls, MD      . ondansetron (ZOFRAN) injection 4 mg  4 mg Intravenous Q6H PRN Vianne Bulls, MD      . pantoprazole (PROTONIX) EC tablet 80 mg  80 mg Oral Daily Vianne Bulls, MD   80 mg at 08/21/15 1127  . technetium TC 13M diethylenetriame-pentaacetic acid (DTPA) injection 31.8 milli Curie  31.8 milli Curie Intravenous Once PRN Forde Dandy, MD        Prescriptions prior to admission  Medication Sig Dispense Refill Last Dose  . acetaminophen (TYLENOL) 500 MG tablet Take 500 mg by mouth daily as needed for moderate pain.   08/14/2015 at Unknown time  . ALPRAZolam (XANAX) 1 MG tablet Take 0.5 mg by mouth at bedtime.   08/14/2015 at Unknown time  . aspirin EC 81 MG tablet Take 81 mg by mouth daily.   08/15/2015 at 0900  . gabapentin (NEURONTIN) 600 MG tablet Take 600  mg by mouth at bedtime.   08/14/2015 at Unknown time  . insulin glargine (LANTUS) 100 UNIT/ML injection Inject 20-45 Units into the skin  at bedtime. Inject 45 units in the morning and in the evening take anywhere from 20 to 35 units depending on blood glucose levels   08/15/2015 at Unknown time  . isosorbide mononitrate (IMDUR) 60 MG 24 hr tablet Take 60 mg by mouth daily.   08/15/2015 at Unknown time  . metoprolol (LOPRESSOR) 50 MG tablet Take 50 mg by mouth 2 (two) times daily.   08/15/2015 at 0900  . omeprazole (PRILOSEC OTC) 20 MG tablet Take 20 mg by mouth daily as needed (acid reflux).   Past Week at Unknown time    History reviewed. No pertinent family history.   Review of Systems:       Cardiac Review of Systems: Y or N  Chest Pain [ tightness   ]  Resting SOB [  yes] Exertional SOB  [ yes ]  Orthopnea [ no ]   Pedal Edema [  no ]    Palpitations [ no ] Syncope  [no ]   Presyncope [ no  ]  General Review of Systems: [Y] = yes [  ]=no Constitional: recent weight change [  ]; anorexia [  ]; fatigue [ yes ]; nausea [  ]; night sweats [  ]; fever [  ]; or chills [  ]                                                               Dental: poor dentition[  ]; Last Dentist visit: Unknown   Eye : blurred vision [  ]; diplopia [   ]; vision changes [  ];  Amaurosis fugax[  ]; Resp: cough [place]  ];  wheezing[  ];  hemoptysis[  ]; shortness of breath[  ]; paroxysmal nocturnal dyspnea[  ]; dyspnea on exertion[  ]; or orthopnea[  ];  GI:  gallstones[yes status post cholecystectomy  ], vomiting[  ];  dysphagia[  ]; melena[  ];  hematochezia [  ]; heartburn[  ];   Hx of  Colonoscopy[  ]; GU: kidney stones [  ]; hematuria[  ];   dysuria [  ];  nocturia[  ];  history of     obstruction [  ]; urinary frequency [  ] history of prostate cancer             Skin: rash, swelling[  ];, hair loss[  ];  peripheral edema[  ];  or itching[  ]; Musculosketetal: myalgias[  ];  joint swelling[  ];  joint erythema[  ];  joint pain[  ];  back pain[  ];  Heme/Lymph: bruising[  ];  bleeding[  ];  anemia[  ];  Neuro: TIA[  ];  headaches[  ];  stroke[  ];   vertigo[  ];  seizures[  ];   paresthesias[  ];  difficulty walking[  ];  Psych:depression[  ]; anxiety[  ];  Endocrine: diabetes[yes A1c pending  ];  thyroid dysfunction[  ];  Immunizations: Flu [  ]; Pneumococcal[  ];  Other: Right-hand dominant  Physical Exam: BP 139/80 mmHg  Pulse 82  Temp(Src) 97.8 F (36.6 C) (Oral)  Resp 20  Ht  5\' 5"  (1.651 m)  Wt 171 lb 1.6 oz (77.61 kg)  BMI 28.47 kg/m2  SpO2 100%       Physical Exam  General: Well-developed elderly Hispanic male no acute distress HEENT: Normocephalic pupils equal , dentition adequate Neck: Supple without JVD, adenopathy, or bruit Chest: Scattered wheezes but otherwise symmetrical breath sounds, no rhonchi, no tenderness             or deformity Cardiovascular: Regular rate and rhythm, no murmur, no gallop, peripheral pulses             palpable in all extremities Abdomen:  Soft, nontender, no palpable mass or organomegaly Extremities: Warm, well-perfused, no clubbing cyanosis edema or tenderness,              no venous stasis changes of the legs Rectal/GU: Deferred Neuro: Grossly non--focal and symmetrical throughout Skin: Clean and dry without rash or ulceration   Diagnostic Studies & Laboratory data:     Recent Radiology Findings:   Dg Chest 2 View  08/21/2015  CLINICAL DATA:  No acute abnormalities. EXAM: CHEST  2 VIEW COMPARISON:  08/15/2015 FINDINGS: Normal heart size, mediastinal contours and pulmonary vascularity. Atherosclerotic calcification aorta. Lungs clear. No pulmonary infiltrate, pleural effusion or pneumothorax. Calcification of the anterior longitudinal ligament of the thoracic spine question diffuse idiopathic skeletal hyperostosis. IMPRESSION: No definite sclerotic metastases identified. Electronically Signed   By: Lavonia Dana M.D.   On: 08/21/2015 09:27     I have independently reviewed the above radiologic studies.  Recent Lab Findings: Lab Results  Component Value Date   WBC 6.3  08/21/2015   HGB 10.8* 08/21/2015   HCT 32.5* 08/21/2015   PLT 128* 08/21/2015   GLUCOSE 112* 08/21/2015   NA 140 08/21/2015   K 4.4 08/21/2015   CL 109 08/21/2015   CREATININE 2.14* 08/21/2015   BUN 27* 08/21/2015   CO2 20* 08/21/2015   INR 1.08 08/21/2015   HGBA1C 6.9* 08/15/2015      Assessment / Plan:     Patient has severe multivessel coronary disease with diffuse .80 disease of the LAD, occlusion of the right coronary and occlusion of the circumflex with extensive collateralization to maintain normal LV function at rest. He has chronic renal insufficiency with proteinuria. He has active wheezing probably related to pulmonary edema--PFTs are pending. He has diabetes with A1c 6.9  Patient would benefit from CABG but at high risk due to his significant renal insufficiency and probable peripheral vascular disease and COPD. PFTs and pre-CABG Dopplers are pending. We'll follow creatinine and schedule patient for surgery later this week    @ME1 @ 08/21/2015 5:48 PM

## 2015-08-21 NOTE — Progress Notes (Signed)
CARDIAC REHAB PHASE I   PRE:  Rate/Rhythm: 85 SR    BP: sitting 156/76    SaO2: 100 RA  MODE:  Ambulation: 460 ft   POST:  Rate/Rhythm: 88 SR    BP: sitting 164/70     SaO2: 100 RA  Tolerated fairly well. Pt enjoyed walking however after walk he did endorse slight chest pressure. It went away with 1 minute of rest. Discussed sternal precautions, mobility, and d/c plan (his wife will be with him). Gave him materials and videos to watch (in [place]). He needs IS. [id number]   [place], ACSM 08/21/2015 10:39 AM

## 2015-08-21 NOTE — Progress Notes (Addendum)
PROGRESS NOTE                                                                                                                                                                                                             Patient Demographics:    Daniel Pacheco, is a 75 y.o. male, DOB - [birth year], [id number]  Admit date - 08/15/2015   Admitting Physician Vianne Bulls, MD  Outpatient Primary MD for the patient is No primary care provider on file.  LOS - 5  Outpatient Specialists: In [place]  Chief Complaint  Patient presents with  . Cough  . Wheezing       Brief Narrative 75 y/o male with history of DM2, HTN, CKD, CAD with hx of MI presented with one week hx of sob, cough and wheezing. He is in [place] visiting his daughter from [place]. He was mostly complaining of dyspnea on exertion, although he states he has had some chest discomfort with walking up the stairs at his daughter's house. He denied any f/c or sick contacts. He denied any dizziness, N/V or syncope. He is a former smoker x 76yrs but quit over 30 years ago. The patient used his daughter's nebulizer without appreciable relief of his symptoms.   He was found to have elevated troponin and d-dimer. VQ scan was low probability. Had an abnormal EKG,the patient was started on heparin drip and admitted for further evaluation. Cardiology was consulted. The patient had a high risk Myoview. Heart catheterization on 08/20/2015 showed severe multivessel disease. The patient was answered from [facility] to [facility] for CABG on 08/20/2015, I took over his care on 08/21/2015.    Subjective:    [facility] today has, No headache, No chest pain, No abdominal pain - No Nausea, No new weakness tingling or numbness, No Cough - SOB. Is having some diarrhea.   Assessment  & Plan :     1.NSTEMI presenting with wheezing. History of CAD. Seen by cardiology,  had a positive nuclear stress test, left heart cath showed multivessel disease, currently on aspirin, statin, Imdur, beta blocker and heparin drip, cardiothoracic surgery consulted for CABG. Defer management of this problem to cardiology and cardiothoracic surgery. Currently pain-free.  2. Diarrhea. Patient has had 7-10 loose bowel movements in the last 12 hours, check for C. difficile, if  negative place on Imodium.  3. Chronic diastolic CHF. EF 60%, currently compensated, cautious with IV fluids. Monitor.  4. Mild continuous wheezing. No shortness of breath or hypoxia, chest x-ray stable, stable on room air, could be angina equivalent. Continue nebulizer treatments and treatment for CAD.  5. Renal failure. 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 Creatinine and he close to 2. Gently hydrate and monitor.  6. Hypertension. Stable on beta blocker and Imdur.  7. Anemia of chronic disease. Anemia panel inconclusive, ferritin and B-12 levels normal, iron levels normal, outpatient workup by PCP no need for transfusion currently.  8. Dyslipidemia. On statin.  9. DM type II. On Lantus and sliding scale.  Lab Results  Component Value Date   HGBA1C 6.9* 08/15/2015   CBG (last 3)   Recent Labs  08/20/15 1659 08/20/15 2240 08/21/15 0655  GLUCAP 127* 137* 109*      Code Status : Full  Family Communication  : None present  Disposition Plan  : To be decided  Barriers For Discharge : CABG, diarrhea workup  Consults  :  Cardiology, cardiothoracic surgery  Procedures  :  VQ scan. Low probability  TTE - Left ventricle: The cavity size was normal. Wall thickness was normal. Systolic function was normal. The estimated ejection fraction was in the range of 60% to 65%. Wall motion was normal; there were no regional wall motion abnormalities. Features are consistent with a pseudonormal left ventricular filling pattern, with concomitant abnormal relaxation and  increased filling pressure (grade 2 diastolic dysfunction). - Aortic valve: Moderately calcified annulus. There was mild stenosis. Valve area (VTI): 1.35 cm^2. Valve area (Vmax): 1.25 cm^2. Valve area (Vmean): 1.23 cm^2.  L Heart Cath -    Ost 1st Diag to 1st Diag lesion, 99% stenosed.  Prox Cx lesion, 50% stenosed.  2nd Mrg lesion, 99% stenosed.  Prox LAD lesion, 80% stenosed.  Ost RCA to Mid RCA lesion, 100% stenosed.  Mid Cx lesion, 60% stenosed.  Ost LM to LM lesion, 25% stenosed.  Severe multivessel CAD with evidence for coronary calcification. There is irregular plaque of 25% in the mid left main, 80% focal stenosis in the LAD after the takeoff of a first diagonal vessel. The first diagonal vessel appeared to have thrombus and was subtotally occluded after a small branch. There was collateralization to the distal diagonal vessel; 50% proximal circumflex stenosis with 99% stenosis in the midportion of the obtuse marginal branch and 60% AV groove sooner stenosis with total occlusion of the RCA near its ostium. There are extensive left to right collaterals supplying the RCA up to the mid vessel.  RECOMMENDATION: The patient will not be transported back to [facility]. Surgical consultation will be obtained for CABG revascularization surgery.   DVT Prophylaxis  :  Heparin gtt  Lab Results  Component Value Date   PLT 128* 08/21/2015    Antibiotics  :    Anti-infectives    Start     Dose/Rate Route Frequency Ordered Stop   08/20/15 0600  piperacillin-tazobactam (ZOSYN) IVPB 3.375 g  Status:  Discontinued     3.375 g 12.5 mL/hr over 240 Minutes Intravenous 3 times per day 08/20/15 0542 08/20/15 0548        Objective:   Filed Vitals:   08/20/15 1330 08/20/15 1400 08/20/15 2059 08/21/15 0525  BP: 141/84 138/79 159/86 156/83  Pulse: 71 76 80 80  Temp:   98.2 F (36.8 C) 98 F (36.7  C)  TempSrc:   Oral Oral  Resp:   18 18  Height:      Weight:    77.61  kg (171 lb 1.6 oz)  SpO2:   98% 99%    Wt Readings from Last 3 Encounters:  08/21/15 77.61 kg (171 lb 1.6 oz)     Intake/Output Summary (Last 24 hours) at 08/21/15 1014 Last data filed at 08/21/15 0842  Gross per 24 hour  Intake 4583.79 ml  Output    325 ml  Net 4258.79 ml     Physical Exam  Awake Alert, Oriented X 3, No new F.N deficits, Normal affect [place].AT,PERRAL Supple Neck,No JVD, No cervical lymphadenopathy appriciated.  Symmetrical Chest wall movement, Good air movement bilaterally, Mild wheezing RRR,No Gallops,Rubs or new Murmurs, No Parasternal Heave +ve B.Sounds, Abd Soft, No tenderness, No organomegaly appriciated, No rebound - guarding or rigidity. No Cyanosis, Clubbing or edema, No new Rash or bruise      Data Review:    CBC  Recent Labs Lab 08/15/15 1353  08/17/15 0434 08/18/15 0515 08/19/15 0431 08/20/15 0449 08/21/15 0737  WBC 6.4  < > 6.2 6.7 6.4 5.8 6.3  HGB 11.9*  < > 10.6* 11.9* 11.1* 10.8* 10.8*  HCT 35.8*  < > 31.7* 35.5* 33.6* 32.2* 32.5*  PLT 185  < > 157 171 155 135* 128*  MCV 96.5  < > 97.2 94.7 96.8 96.4 97.0  MCH 32.1  < > 32.5 31.7 32.0 32.3 32.2  MCHC 33.2  < > 33.4 33.5 33.0 33.5 33.2  RDW 14.7  < > 14.9 14.7 15.0 14.8 14.8  LYMPHSABS 1.8  --   --   --   --   --   --   MONOABS 0.4  --   --   --   --   --   --   EOSABS 0.7  --   --   --   --   --   --   BASOSABS 0.0  --   --   --   --   --   --   < > = values in this interval not displayed.  Chemistries   Recent Labs Lab 08/17/15 0434 08/18/15 0515 08/19/15 1637 08/20/15 0449 08/21/15 0737  NA 142 143 141 142 140  K 4.6 4.7 4.9 5.0 4.4  CL 105 104 107 108 109  CO2 27 28 26 24  20*  GLUCOSE 107* 104* 192* 153* 112*  BUN 37* 36* 38* 35* 27*  CREATININE 2.77* 2.63* 2.57* 2.38* 2.14*  CALCIUM 9.0 9.8 9.3 8.9 9.1  MG 1.7  --   --   --   --    ------------------------------------------------------------------------------------------------------------------ No results  for input(s): CHOL, HDL, LDLCALC, TRIG, CHOLHDL, LDLDIRECT in the last 72 hours.  Lab Results  Component Value Date   HGBA1C 6.9* 08/15/2015   ------------------------------------------------------------------------------------------------------------------ No results for input(s): TSH, T4TOTAL, T3FREE, THYROIDAB in the last 72 hours.  Invalid input(s): FREET3 ------------------------------------------------------------------------------------------------------------------ No results for input(s): VITAMINB12, FOLATE, FERRITIN, TIBC, IRON, RETICCTPCT in the last 72 hours.  Coagulation profile  Recent Labs Lab 08/15/15 1858  INR 1.00    No results for input(s): DDIMER in the last 72 hours.  Cardiac Enzymes  Recent Labs Lab 08/15/15 1942 08/16/15 0055 08/16/15 0633  TROPONINI 0.41* 0.32* 0.34*   ------------------------------------------------------------------------------------------------------------------    Component Value Date/Time   BNP 81.6 08/18/2015 0515    Inpatient Medications  Scheduled Meds: . ALPRAZolam  0.5 mg Oral QHS  .  aspirin EC  81 mg Oral Daily  . atorvastatin  80 mg Oral q1800  . gabapentin  600 mg Oral QHS  . insulin aspart  0-15 Units Subcutaneous TID WC  . insulin glargine  10 Units Subcutaneous QHS  . ipratropium-albuterol  3 mL Nebulization TID  . isosorbide mononitrate  60 mg Oral Daily  . metoprolol  50 mg Oral BID  . pantoprazole  80 mg Oral Daily   Continuous Infusions: . sodium chloride 75 mL/hr at 08/20/15 1809  . heparin 900 Units/hr (08/21/15 0335)   PRN Meds:.acetaminophen, ALPRAZolam, diazepam, guaiFENesin-dextromethorphan, ipratropium-albuterol, nitroGLYCERIN, ondansetron (ZOFRAN) IV, technetium TC 53M diethylenetriame-pentaacetic acid  Micro Results No results found for this or any previous visit (from the past 240 hour(s)).  Radiology Reports Dg Chest 2 View  08/21/2015  CLINICAL DATA:  No acute abnormalities. EXAM:  CHEST  2 VIEW COMPARISON:  08/15/2015 FINDINGS: Normal heart size, mediastinal contours and pulmonary vascularity. Atherosclerotic calcification aorta. Lungs clear. No pulmonary infiltrate, pleural effusion or pneumothorax. Calcification of the anterior longitudinal ligament of the thoracic spine question diffuse idiopathic skeletal hyperostosis. IMPRESSION: No definite sclerotic metastases identified. Electronically Signed   By: Lavonia Dana M.D.   On: 08/21/2015 09:27   Dg Chest 2 View  08/15/2015  CLINICAL DATA:  Shortness of breath with cough and mid chest pain 1 week. History of prostate cancer. EXAM: CHEST  2 VIEW COMPARISON:  None. FINDINGS: Lungs are somewhat hypoinflated without focal consolidation or effusion. Cardiomediastinal silhouette is within normal. There is calcified plaque over the aortic arch. Mild degenerate change of the spine and shoulders. IMPRESSION: No active cardiopulmonary disease. Electronically Signed   By: Marin Olp M.D.   On: 08/15/2015 14:22   Nm Myocar Multi W/spect W/wall Motion / Ef  08/17/2015  CLINICAL DATA:  Ex-smoker.  Diabetes.  Hypertension. EXAM: MYOCARDIAL IMAGING WITH SPECT (REST AND PHARMACOLOGIC-STRESS) GATED LEFT VENTRICULAR WALL MOTION STUDY LEFT VENTRICULAR EJECTION FRACTION TECHNIQUE: Standard myocardial SPECT imaging was performed after resting intravenous injection of 10 mCi Tc-39m sestamibi. Subsequently, intravenous infusion of Lexiscan was performed under the supervision of the Cardiology staff. At peak effect of the drug, 30 mCi Tc-23m sestamibi was injected intravenously and standard myocardial SPECT imaging was performed. Quantitative gated imaging was also performed to evaluate left ventricular wall motion, and estimate left ventricular ejection fraction. COMPARISON:  Plain film of 08/15/2015 FINDINGS: Perfusion: Large rest defect involving the inferolateral wall, mid base. Areas of moderate reversibility involving the mid anterior lateral and  inferior walls. Wall Motion: Global hypokinesis. Akinesis to dyskinesis involving the lateral wall. Left Ventricular Ejection Fraction: 35 % End diastolic volume 123456 ml End systolic volume 76 ml IMPRESSION: 1. Large rest defect involving the inferior lateral wall with areas of reversibility more anteriorly and inferiorly. 2. Global hypokinesis with dyskinesis involving the lateral wall. 3. Left ventricular ejection fraction 35% 4. High-risk stress test findings*. *2012 Appropriate Use Criteria for Coronary Revascularization Focused Update: J Am Coll Cardiol. [id number]. http://content.airportbarriers.com.aspx?articleid=[email or web address] These results will be called to the ordering clinician or representative by the Radiologist Assistant, and communication documented in the PACS or zVision Dashboard. Electronically Signed   By: Abigail Miyamoto M.D.   On: 08/17/2015 13:32   Nm Pulmonary Perf And Vent  08/15/2015  CLINICAL DATA:  Shortness of breath and cough for several days EXAM: NUCLEAR MEDICINE VENTILATION - PERFUSION LUNG SCAN Views: Anterior, posterior, left lateral, right lateral, RPO, LPO, RAO, LAO -ventilation and perfusion RADIOPHARMACEUTICALS:  31.8 mCi Technetium-68m DTPA aerosol inhalation  and 4.4 mCi Technetium-49m MAA IV COMPARISON:  Chest radiograph August 15, 2015 FINDINGS: Ventilation: There are patchy areas of decreased ventilation, primarily in the upper lobes, in a nonsegmental distribution. No well-defined segmental ventilation defects evident. Perfusion: There are scattered nonsegmental perfusion defects which essentially match the ventilation defects. There is no appreciable ventilation/perfusion mismatch. IMPRESSION: There are scattered matching nonsegmental ventilation and perfusion defects. There is no appreciable ventilation/ perfusion mismatch. These findings constitute an overall low probability of pulmonary embolus. Electronically Signed   By: Lowella Grip III M.D.   On: 08/15/2015  17:26    Time Spent in minutes  25   SINGH,PRASHANT K M.D on 08/21/2015 at 10:14 AM  Between 7am to 7pm - Pager - [phone]  After 7pm go to [email or web address] - password [email or web address]  [facility] -  Office  [phone]

## 2015-08-21 NOTE — Progress Notes (Signed)
ANTICOAGULATION CONSULT NOTE - Follow Up Consult  Pharmacy Consult for Heparin Indication: severe multivessel CAD  Allergies  Allergen Reactions  . Nsaids     Pt has kidney problems, wants to avoid all nsaids  . Sulfa Antibiotics Rash   Patient Measurements: Height: 5\' 5"  (165.1 cm) Weight: 171 lb 1.6 oz (77.61 kg) IBW/kg (Calculated) : 61.5 Heparin Dosing Weight: 77 kg  Vital Signs: Temp: 98 F (36.7 C) (04/19 0525) Temp Source: Oral (04/19 0525) BP: 156/83 mmHg (04/19 0525) Pulse Rate: 80 (04/19 0525)  Labs:  Recent Labs  08/19/15 0431  08/19/15 1637 08/19/15 1953 08/20/15 0449 08/21/15 0221 08/21/15 0737  HGB 11.1*  --   --   --  10.8*  --  10.8*  HCT 33.6*  --   --   --  32.2*  --  32.5*  PLT 155  --   --   --  135*  --  128*  HEPARINUNFRC  --   < >  --  0.33 0.24* 0.18*  --   CREATININE  --   --  2.57*  --  2.38*  --  2.14*  < > = values in this interval not displayed.  Estimated Creatinine Clearance: 29.1 mL/min (by C-G formula based on Cr of 2.14).   Assessment: 75 y.o M on heparin for r/o ACS. S/p Cath 4/18: severe multivessel CAD, TCTS consult for CABG. Will continue heparin for now. Last HL is therapeutic at 0.49, after rate increased. Hgb stable at 10.8, plts low but stable at 128.  Goal of Therapy:  Heparin level 0.3-0.7 units/ml Monitor platelets by anticoagulation protocol: Yes   Plan:  Continue heparin gtt at 900 units/hr Monitor daily HL, CBC, s/s of bleed Check 8 hr cHL

## 2015-08-21 NOTE — Progress Notes (Signed)
ANTICOAGULATION CONSULT NOTE - Follow Up Consult  Pharmacy Consult for Heparin Indication: severe multivessel CAD  Allergies  Allergen Reactions  . Nsaids     Pt has kidney problems, wants to avoid all nsaids  . Sulfa Antibiotics Rash    Patient Measurements: Height: 5\' 5"  (165.1 cm) Weight: 171 lb 8 oz (77.792 kg) IBW/kg (Calculated) : 61.5 Heparin Dosing Weight: 77 kg  Vital Signs: Temp: 98.2 F (36.8 C) (04/18 2059) Temp Source: Oral (04/18 2059) BP: 159/86 mmHg (04/18 2059) Pulse Rate: 80 (04/18 2059)  Labs:  Recent Labs  08/18/15 0515  08/19/15 0431  08/19/15 1637 08/19/15 1953 08/20/15 0449 08/21/15 0221  HGB 11.9*  --  11.1*  --   --   --  10.8*  --   HCT 35.5*  --  33.6*  --   --   --  32.2*  --   PLT 171  --  155  --   --   --  135*  --   HEPARINUNFRC 0.92*  < >  --   < >  --  0.33 0.24* 0.18*  CREATININE 2.63*  --   --   --  2.57*  --  2.38*  --   < > = values in this interval not displayed.  Estimated Creatinine Clearance: 26.2 mL/min (by C-G formula based on Cr of 2.38).   Assessment: 75 y.o M s/p cardiac cath which showed severe multivessel CAD - to get TCTS consult for CABG. Heparin resumed 6 hours post sheath. Heparin level subtherapeutic on 750 units/hr. No bleeding reported per RN. Some beeping of line but always restarted ok per RN.  Goal of Therapy:  Heparin level 0.3-0.7 units/ml Monitor platelets by anticoagulation protocol: Yes   Plan:  Increase heparin to 900 units/hr Will f/u 8hr heparin level  Sherlon Handing, PharmD, BCPS Clinical pharmacist, pager [phone] 08/21/2015 2:54 AM

## 2015-08-21 NOTE — Progress Notes (Signed)
   Patient has decided to go home to [place] for surgery and wants to be discharged.  Spoke to patient with family present via an interpreter. He understands risk of traveling. Wants to yake information home to have surgery.

## 2015-08-22 LAB — GLUCOSE, CAPILLARY
GLUCOSE-CAPILLARY: 89 mg/dL (ref 65–99)
Glucose-Capillary: 139 mg/dL — ABNORMAL HIGH (ref 65–99)

## 2015-08-22 MED ORDER — ATORVASTATIN CALCIUM 80 MG PO TABS: 80.0000 mg | ORAL_TABLET | Freq: Every day | ORAL | Status: AC

## 2015-08-22 MED ORDER — NITROGLYCERIN 0.4 MG SL SUBL: 0.4000 mg | SUBLINGUAL_TABLET | SUBLINGUAL | Status: AC | PRN

## 2015-08-22 MED ORDER — PANTOPRAZOLE SODIUM 40 MG PO TBEC: 80.0000 mg | DELAYED_RELEASE_TABLET | Freq: Every day | ORAL | Status: DC

## 2015-08-22 MED ORDER — ISOSORBIDE MONONITRATE ER 60 MG PO TB24
120.0000 mg | ORAL_TABLET | Freq: Every day | ORAL | Status: DC
  Administered 2015-08-22: 120 mg via ORAL
  Filled 2015-08-22: qty 2

## 2015-08-22 MED ORDER — IPRATROPIUM-ALBUTEROL 0.5-2.5 (3) MG/3ML IN SOLN: 3.0000 mL | Freq: Four times a day (QID) | RESPIRATORY_TRACT | Status: DC | PRN

## 2015-08-22 MED ORDER — FUROSEMIDE 10 MG/ML IJ SOLN
20.0000 mg | Freq: Once | INTRAMUSCULAR | Status: AC
  Administered 2015-08-22: 20 mg via INTRAVENOUS
  Filled 2015-08-22: qty 2

## 2015-08-22 MED ORDER — ISOSORBIDE MONONITRATE ER 120 MG PO TB24: 120.0000 mg | ORAL_TABLET | Freq: Every day | ORAL | Status: DC

## 2015-08-22 NOTE — Progress Notes (Signed)
Pt. Refusing to have labs drawn this am. Text page sent to on call NP, K. Schorr. RN will continue to monitor pt. For changes in condition. Daniel Pacheco, Daniel Pacheco

## 2015-08-22 NOTE — Progress Notes (Signed)
CARDIAC REHAB PHASE I   Pt sts that he walked earlier without angina like he had yesterday with me. Family present. Discussed NTG and how to take if sx. Instructed pt and family to call 911 if NTG does not work. He is to call cardiologist if he is taking NTG often. Voiced understanding. [place], ACSM 08/22/2015 11:56 AM

## 2015-08-22 NOTE — Discharge Summary (Signed)
Daniel Pacheco, is a 75 y.o. male  DOB [birth year]  MRN [id number].  Admission date:  08/15/2015  Admitting Physician  Vianne Bulls, MD  Discharge Date:  08/22/2015   Primary MD  No primary care provider on file.  Recommendations for primary care physician for things to follow:   Monitor secondary to his factors for CAD, needs close cardiothoracic surgery and cardiology follow-up   Admission Diagnosis  SOB (shortness of breath) [R06.02]   Discharge Diagnosis  SOB (shortness of breath) [R06.02]    Principal Problem:   NSTEMI, initial episode of care [facility]) Active Problems:   SOB (shortness of breath)   Insulin dependent diabetes mellitus (HCC)   Essential hypertension   CKD (chronic kidney disease)   Normocytic anemia   H/O prostate cancer   Left shoulder pain   Bronchospasm   Respiratory distress   NSTEMI (non-ST elevated myocardial infarction) (HCC)   Elevated troponin   CAD (coronary artery disease)      Past Medical History  Diagnosis Date  . Hypertension   . Diabetes mellitus without complication ([place])   . Cancer [facility])     Past Surgical History  Procedure Laterality Date  . Cardiac catheterization N/A 08/20/2015    Procedure: Left Heart Cath and Coronary Angiography;  Surgeon: Troy Sine, MD;  Location: [place] [facility];  Service: Cardiovascular;  Laterality: N/A;       HPI  :    75 y/o male with history of DM2, HTN, CKD, CAD with hx of MI presented with one week hx of sob, cough and wheezing. He is in [place] visiting his daughter from [place]. He was mostly complaining of dyspnea on exertion, although he states he has had some chest discomfort with walking up the stairs at his daughter's house. He denied any f/c or sick contacts. He denied any dizziness, N/V or syncope. He is a  former smoker x 33yrs but quit over 30 years ago. The patient used his daughter's nebulizer without appreciable relief of his symptoms.   He was found to have elevated troponin and d-dimer. VQ scan was low probability. Had an abnormal EKG,the patient was started on heparin drip and admitted for further evaluation. Cardiology was consulted. The patient had a high risk Myoview. Heart catheterization on 08/20/2015 showed severe multivessel disease. The patient was answered from [facility] to [facility] for CABG on 08/20/2015, I took over his care on 08/21/2015.     Hospital Course:    1.NSTEMI presenting with wheezing. History of CAD. Seen by cardiology, had a positive nuclear stress test, left heart cath showed multivessel disease, currently on aspirin, statin, Imdur, beta blocker His heparin drip was stopped today by cardiology, both cardiology and cardiothoracic surgery saw the patient, plan was to do CABG on either 08/22/2015 on 08/23/2015 however patient and his family had detailed discussions with her urologist Dr. Tamala Julian on 08/21/2015 and they refused to undergo CABG.  They would prefer to go back to [place] in June and get  procedure done there, he was clearly warned by the cardiologist and me that this is not safe and can result in death or disability, he understands the risks but still wants to proceed going home today. In my opinion he is here to June and the chances of him coming back in a worse shape up pretty high. He will be discharged on aspirin, statin, Imdur, beta blocker with some sublingual nitroglycerin.   2. Diarrhea. Likely medication related completely resolved.  3. Chronic diastolic CHF. EF 60%, currently compensated, monitor outpatient.  4. Mild continuous wheezing. No shortness of breath or hypoxia, chest x-ray stable, stable on room air, could be angina equivalent. We'll give him a nebulizer machine and minimal as her medications upon discharge.  5. Renal failure.  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 Creatinine and he close to 2. Her renal function in the outpatient setting.  6. Hypertension. Stable on beta blocker and Imdur.  7. Anemia of chronic disease. Anemia panel inconclusive, ferritin and B-12 levels normal, iron levels normal, outpatient workup by PCP no need for transfusion currently.  8. Dyslipidemia. On statin.  9. DM type II. A1c was 6.9 continue her outpatient regimen follow with PCP.      Follow UP  Follow-up Information    Follow up with [facility]. Schedule an appointment as soon as possible for a visit in 1 week.   Contact information:   [address] [place] [phone]      Follow up with Sinclair Grooms, MD. Schedule an appointment as soon as possible for a visit in 1 week.   Specialty:  Cardiology   Contact information:   [id number] [place] [address] [phone]        Consults obtained - cardiology, cardiothoracic surgery  Discharge Condition: Guarded  Diet and Activity recommendation: See Discharge Instructions below  Discharge Instructions       Discharge Instructions    Discharge instructions    Complete by:  As directed   Follow with Primary MD of choice in 7 days   Get CBC, CMP, 2 view Chest X ray checked  by Primary MD next visit.    Activity: As tolerated with Full fall precautions use walker/cane & assistance as needed   Disposition Home    Diet:   Heart Healthy Low Carb.  Accuchecks 4 times/day, Once in AM empty stomach and then before each meal. Log in all results and show them to your Prim.MD in 3 days. If any glucose reading is under 80 or above 300 call your Prim MD immidiately. Follow Low glucose instructions for glucose under 80 as instructed.   For Heart failure patients - Check your Weight same time everyday, if you gain over 2 pounds, or you develop in  leg swelling, experience more shortness of breath or chest pain, call your Primary MD immediately. Follow Cardiac Low Salt Diet and 1.5 lit/day fluid restriction.   On your next visit with your primary care physician please Get Medicines reviewed and adjusted.   Please request your Prim.MD to go over all Hospital Tests and Procedure/Radiological results at the follow up, please get all Hospital records sent to your Prim MD by signing hospital release before you go home.   If you experience worsening of your admission symptoms, develop shortness of breath, life threatening emergency, suicidal or homicidal thoughts you must seek medical attention immediately by calling  911 or calling your MD immediately  if symptoms less severe.  You Must read complete instructions/literature along with all the possible adverse reactions/side effects for all the Medicines you take and that have been prescribed to you. Take any new Medicines after you have completely understood and accpet all the possible adverse reactions/side effects.   Do not drive, operating heavy machinery, perform activities at heights, swimming or participation in water activities or provide baby sitting services if your were admitted for syncope or siezures until you have seen by Primary MD or a Neurologist and advised to do so again.  Do not drive when taking Pain medications.    Do not take more than prescribed Pain, Sleep and Anxiety Medications  Special Instructions: If you have smoked or chewed Tobacco  in the last 2 yrs please stop smoking, stop any regular Alcohol  and or any Recreational drug use.  Wear Seat belts while driving.   Please note  You were cared for by a hospitalist during your hospital stay. If you have any questions about your discharge medications or the care you received while you were in the hospital after you are discharged, you can call the unit and asked to speak with the hospitalist on call if the  hospitalist that took care of you is not available. Once you are discharged, your primary care physician will handle any further medical issues. Please note that NO REFILLS for any discharge medications will be authorized once you are discharged, as it is imperative that you return to your primary care physician (or establish a relationship with a primary care physician if you do not have one) for your aftercare needs so that they can reassess your need for medications and monitor your lab values.     Increase activity slowly    Complete by:  As directed              Discharge Medications       Medication List    TAKE these medications        acetaminophen 500 MG tablet  Commonly known as:  TYLENOL  Take 500 mg by mouth daily as needed for moderate pain.     ALPRAZolam 1 MG tablet  Commonly known as:  XANAX  Take 0.5 mg by mouth at bedtime.     aspirin EC 81 MG tablet  Take 81 mg by mouth daily.     atorvastatin 80 MG tablet  Commonly known as:  LIPITOR  Take 1 tablet (80 mg total) by mouth daily at 6 PM.     gabapentin 600 MG tablet  Commonly known as:  NEURONTIN  Take 600 mg by mouth at bedtime.     insulin glargine 100 UNIT/ML injection  Commonly known as:  LANTUS  Inject 20-45 Units into the skin at bedtime. Inject 45 units in the morning and in the evening take anywhere from 20 to 35 units depending on blood glucose levels     ipratropium-albuterol 0.5-2.5 (3) MG/3ML Soln  Commonly known as:  DUONEB  Take 3 mLs by nebulization every 6 (six) hours as needed.     isosorbide mononitrate 120 MG 24 hr tablet  Commonly known as:  IMDUR  Take 1 tablet (120 mg total) by mouth daily.     metoprolol 50 MG tablet  Commonly known as:  LOPRESSOR  Take 50 mg by mouth 2 (two) times daily.     nitroGLYCERIN 0.4 MG SL tablet  Commonly known as:  NITROSTAT  Place 1 tablet (0.4 mg total) under the tongue every 5 (five) minutes x 3 doses as needed for chest pain.      omeprazole 20 MG tablet  Commonly known as:  PRILOSEC OTC  Take 20 mg by mouth daily as needed (acid reflux).     pantoprazole 40 MG tablet  Commonly known as:  PROTONIX  Take 2 tablets (80 mg total) by mouth daily.        Major procedures and Radiology Reports - PLEASE review detailed and final reports for all details, in brief -   VQ scan. Low probability  TTE - Left ventricle: The cavity size was normal. Wall thickness was normal. Systolic function was normal. The estimated ejection fraction was in the range of 60% to 65%. Wall motion was normal; there were no regional wall motion abnormalities. Features are consistent with a pseudonormal left ventricular filling pattern, with concomitant abnormal relaxation and increased filling pressure (grade 2 diastolic dysfunction). - Aortic valve: Moderately calcified annulus. There was mild stenosis. Valve area (VTI): 1.35 cm^2. Valve area (Vmax): 1.25 cm^2. Valve area (Vmean): 1.23 cm^2.  L Heart Cath -    Ost 1st Diag to 1st Diag lesion, 99% stenosed.  Prox Cx lesion, 50% stenosed.  2nd Mrg lesion, 99% stenosed.  Prox LAD lesion, 80% stenosed.  Ost RCA to Mid RCA lesion, 100% stenosed.  Mid Cx lesion, 60% stenosed.  Ost LM to LM lesion, 25% stenosed.  Severe multivessel CAD with evidence for coronary calcification. There is irregular plaque of 25% in the mid left main, 80% focal stenosis in the LAD after the takeoff of a first diagonal vessel. The first diagonal vessel appeared to have thrombus and was subtotally occluded after a small branch. There was collateralization to the distal diagonal vessel; 50% proximal circumflex stenosis with 99% stenosis in the midportion of the obtuse marginal branch and 60% AV groove sooner stenosis with total occlusion of the RCA near its ostium. There are extensive left to right collaterals supplying the RCA up to the mid vessel.  RECOMMENDATION: The patient will not be transported back to  [facility]. Surgical consultation will be obtained for CABG revascularization surgery.    Dg Chest 2 View  08/21/2015  CLINICAL DATA:  No acute abnormalities. EXAM: CHEST  2 VIEW COMPARISON:  08/15/2015 FINDINGS: Normal heart size, mediastinal contours and pulmonary vascularity. Atherosclerotic calcification aorta. Lungs clear. No pulmonary infiltrate, pleural effusion or pneumothorax. Calcification of the anterior longitudinal ligament of the thoracic spine question diffuse idiopathic skeletal hyperostosis. IMPRESSION: No definite sclerotic metastases identified. Electronically Signed   By: Lavonia Dana M.D.   On: 08/21/2015 09:27   Dg Chest 2 View  08/15/2015  CLINICAL DATA:  Shortness of breath with cough and mid chest pain 1 week. History of prostate cancer. EXAM: CHEST  2 VIEW COMPARISON:  None. FINDINGS: Lungs are somewhat hypoinflated without focal consolidation or effusion. Cardiomediastinal silhouette is within normal. There is calcified plaque over the aortic arch. Mild degenerate change of the spine and shoulders. IMPRESSION: No active cardiopulmonary disease. Electronically Signed   By: Marin Olp M.D.   On: 08/15/2015 14:22   Nm Myocar Multi W/spect W/wall Motion / Ef  08/17/2015  CLINICAL DATA:  Ex-smoker.  Diabetes.  Hypertension. EXAM: MYOCARDIAL IMAGING WITH SPECT (REST AND PHARMACOLOGIC-STRESS) GATED LEFT VENTRICULAR WALL MOTION STUDY LEFT VENTRICULAR EJECTION FRACTION TECHNIQUE: Standard myocardial SPECT imaging was performed after resting intravenous injection of 10 mCi Tc-16m sestamibi. Subsequently, intravenous infusion of Lexiscan  was performed under the supervision of the Cardiology staff. At peak effect of the drug, 30 mCi Tc-35m sestamibi was injected intravenously and standard myocardial SPECT imaging was performed. Quantitative gated imaging was also performed to evaluate left ventricular wall motion, and estimate left ventricular ejection fraction. COMPARISON:   Plain film of 08/15/2015 FINDINGS: Perfusion: Large rest defect involving the inferolateral wall, mid base. Areas of moderate reversibility involving the mid anterior lateral and inferior walls. Wall Motion: Global hypokinesis. Akinesis to dyskinesis involving the lateral wall. Left Ventricular Ejection Fraction: 35 % End diastolic volume 123456 ml End systolic volume 76 ml IMPRESSION: 1. Large rest defect involving the inferior lateral wall with areas of reversibility more anteriorly and inferiorly. 2. Global hypokinesis with dyskinesis involving the lateral wall. 3. Left ventricular ejection fraction 35% 4. High-risk stress test findings*. *2012 Appropriate Use Criteria for Coronary Revascularization Focused Update: J Am Coll Cardiol. [id number]. http://content.airportbarriers.com.aspx?articleid=1201161 These results will be called to the ordering clinician or representative by the Radiologist Assistant, and communication documented in the PACS or zVision Dashboard. Electronically Signed   By: Abigail Miyamoto M.D.   On: 08/17/2015 13:32   Nm Pulmonary Perf And Vent  08/15/2015  CLINICAL DATA:  Shortness of breath and cough for several days EXAM: NUCLEAR MEDICINE VENTILATION - PERFUSION LUNG SCAN Views: Anterior, posterior, left lateral, right lateral, RPO, LPO, RAO, LAO -ventilation and perfusion RADIOPHARMACEUTICALS:  31.8 mCi Technetium-29m DTPA aerosol inhalation and 4.4 mCi Technetium-103m MAA IV COMPARISON:  Chest radiograph August 15, 2015 FINDINGS: Ventilation: There are patchy areas of decreased ventilation, primarily in the upper lobes, in a nonsegmental distribution. No well-defined segmental ventilation defects evident. Perfusion: There are scattered nonsegmental perfusion defects which essentially match the ventilation defects. There is no appreciable ventilation/perfusion mismatch. IMPRESSION: There are scattered matching nonsegmental ventilation and perfusion defects. There is no appreciable  ventilation/ perfusion mismatch. These findings constitute an overall low probability of pulmonary embolus. Electronically Signed   By: Lowella Grip III M.D.   On: 08/15/2015 17:26    Micro Results      Recent Results (from the past 240 hour(s))  Surgical pcr screen     Status: None   Collection Time: 08/21/15 11:53 AM  Result Value Ref Range Status   MRSA, PCR NEGATIVE NEGATIVE Final   Staphylococcus aureus NEGATIVE NEGATIVE Final    Comment:        The Xpert SA Assay (FDA approved for NASAL specimens in patients over 4 years of age), is one component of a comprehensive surveillance program.  Test performance has been validated by [facility] for patients greater than or equal to 66 year old. It is not intended to diagnose infection nor to guide or monitor treatment.     Today   Subjective    Daniel Pacheco today has no headache,no chest abdominal pain,no new weakness tingling or numbness, feels much better wants to go home today.     Objective   Blood pressure 178/92, pulse 77, temperature 97.9 F (36.6 C), temperature source Oral, resp. rate 14, height 5\' 5"  (1.651 m), weight 76.98 kg (169 lb 11.4 oz), SpO2 100 %.   Intake/Output Summary (Last 24 hours) at 08/22/15 1045 Last data filed at 08/22/15 0900  Gross per 24 hour  Intake  999.5 ml  Output   1050 ml  Net  -50.5 ml    Exam Awake Alert, Oriented x 3, No new F.N deficits, Normal affect [place].AT,PERRAL Supple Neck,No JVD, No cervical lymphadenopathy appriciated.  Symmetrical Chest  wall movement, Good air movement bilaterally, CTAB RRR,No Gallops,Rubs or new Murmurs, No Parasternal Heave +ve B.Sounds, Abd Soft, Non tender, No organomegaly appriciated, No rebound -guarding or rigidity. No Cyanosis, Clubbing or edema, No new Rash or bruise   Data Review   CBC w Diff: Lab Results  Component Value Date   WBC 6.3 08/21/2015   HGB 10.8* 08/21/2015   HCT 32.5* 08/21/2015   HCT 34.3* 08/15/2015    PLT 128* 08/21/2015   LYMPHOPCT 28 08/15/2015   MONOPCT 6 08/15/2015   EOSPCT 11 08/15/2015   BASOPCT 1 08/15/2015    CMP: Lab Results  Component Value Date   NA 140 08/21/2015   K 4.4 08/21/2015   CL 109 08/21/2015   CO2 20* 08/21/2015   BUN 27* 08/21/2015   CREATININE 2.14* 08/21/2015  .   Total Time in preparing paper work, data evaluation and todays exam - 35 minutes  Thurnell Lose M.D on 08/22/2015 at 10:45 AM  [facility]   Office  [phone]

## 2015-08-22 NOTE — Discharge Instructions (Signed)
Follow with Primary MD of choice in 7 days   Get CBC, CMP, 2 view Chest X ray checked  by Primary MD next visit.    Activity: As tolerated with Full fall precautions use walker/cane & assistance as needed   Disposition Home    Diet:   Heart Healthy Low Carb.  Accuchecks 4 times/day, Once in AM empty stomach and then before each meal. Log in all results and show them to your Prim.MD in 3 days. If any glucose reading is under 80 or above 300 call your Prim MD immidiately. Follow Low glucose instructions for glucose under 80 as instructed.   For Heart failure patients - Check your Weight same time everyday, if you gain over 2 pounds, or you develop in leg swelling, experience more shortness of breath or chest pain, call your Primary MD immediately. Follow Cardiac Low Salt Diet and 1.5 lit/day fluid restriction.   On your next visit with your primary care physician please Get Medicines reviewed and adjusted.   Please request your Prim.MD to go over all Hospital Tests and Procedure/Radiological results at the follow up, please get all Hospital records sent to your Prim MD by signing hospital release before you go home.   If you experience worsening of your admission symptoms, develop shortness of breath, life threatening emergency, suicidal or homicidal thoughts you must seek medical attention immediately by calling 911 or calling your MD immediately  if symptoms less severe.  You Must read complete instructions/literature along with all the possible adverse reactions/side effects for all the Medicines you take and that have been prescribed to you. Take any new Medicines after you have completely understood and accpet all the possible adverse reactions/side effects.   Do not drive, operating heavy machinery, perform activities at heights, swimming or participation in water activities or provide baby sitting services if your were admitted for syncope or siezures until you have seen by  Primary MD or a Neurologist and advised to do so again.  Do not drive when taking Pain medications.    Do not take more than prescribed Pain, Sleep and Anxiety Medications  Special Instructions: If you have smoked or chewed Tobacco  in the last 2 yrs please stop smoking, stop any regular Alcohol  and or any Recreational drug use.  Wear Seat belts while driving.   Please note  You were cared for by a hospitalist during your hospital stay. If you have any questions about your discharge medications or the care you received while you were in the hospital after you are discharged, you can call the unit and asked to speak with the hospitalist on call if the hospitalist that took care of you is not available. Once you are discharged, your primary care physician will handle any further medical issues. Please note that NO REFILLS for any discharge medications will be authorized once you are discharged, as it is imperative that you return to your primary care physician (or establish a relationship with a primary care physician if you do not have one) for your aftercare needs so that they can reassess your need for medications and monitor your lab values.

## 2015-08-22 NOTE — Progress Notes (Signed)
Pt refusing labs this AM.  Will continue to monitor.

## 2015-08-22 NOTE — Significant Event (Addendum)
Discharge instruction reviewed with patient and family. A copy of AVS and prescription papers given to patient. Patient verbalize understanding of contents in AVS (medications, following up appointments, special instructions). Patient has all his belongings to take home. Awaiting daughter to pick him up in 25-30 minutes.    Daniel Pacheco

## 2015-08-22 NOTE — Progress Notes (Addendum)
   I have contacted [facility] and informed them that the patient has decided to travel back to [place] to be considered for surgery.  Made plans to have his diagnostic studies 2 disks so that he may carry them with him.  Continue statin, beta blocker, and long-acting nitrates.  Recheck BMET today prior to discharge.  Ambulate vigorously prior to discharge to insure stability. DC IV heparin. Uptitrate nitrate and beta blocker if symptoms.

## 2015-08-23 SURGERY — CORONARY ARTERY BYPASS GRAFTING (CABG)
Anesthesia: General | Site: Chest

## 2015-08-28 ENCOUNTER — Other Ambulatory Visit (HOSPITAL_COMMUNITY): Payer: Self-pay | Admitting: Respiratory Therapy

## 2015-08-29 ENCOUNTER — Encounter: Payer: Medicare (Managed Care) | Admitting: Physician Assistant

## 2015-08-29 NOTE — Progress Notes (Signed)
Patient is no show This encounter was created in error - please disregard. 

## 2015-09-04 ENCOUNTER — Encounter: Payer: Self-pay | Admitting: Physician Assistant

## 2015-10-03 ENCOUNTER — Emergency Department (HOSPITAL_COMMUNITY): Payer: Medicare (Managed Care)

## 2015-10-03 ENCOUNTER — Emergency Department (HOSPITAL_COMMUNITY)
Admission: EM | Admit: 2015-10-03 | Discharge: 2015-10-04 | Disposition: A | Discharge status: Home / Self Care | Payer: Medicare (Managed Care) | Attending: Emergency Medicine | Admitting: Emergency Medicine

## 2015-10-03 ENCOUNTER — Encounter (HOSPITAL_COMMUNITY): Payer: Self-pay | Admitting: Emergency Medicine

## 2015-10-03 DIAGNOSIS — E119 Type 2 diabetes mellitus without complications: Secondary | ICD-10-CM | POA: Diagnosis not present

## 2015-10-03 DIAGNOSIS — Z87891 Personal history of nicotine dependence: Secondary | ICD-10-CM | POA: Insufficient documentation

## 2015-10-03 DIAGNOSIS — Z859 Personal history of malignant neoplasm, unspecified: Secondary | ICD-10-CM | POA: Diagnosis not present

## 2015-10-03 DIAGNOSIS — M10032 Idiopathic gout, left wrist: Secondary | ICD-10-CM | POA: Diagnosis not present

## 2015-10-03 DIAGNOSIS — Z7982 Long term (current) use of aspirin: Secondary | ICD-10-CM | POA: Diagnosis not present

## 2015-10-03 DIAGNOSIS — Z794 Long term (current) use of insulin: Secondary | ICD-10-CM | POA: Insufficient documentation

## 2015-10-03 DIAGNOSIS — M109 Gout, unspecified: Secondary | ICD-10-CM

## 2015-10-03 DIAGNOSIS — I10 Essential (primary) hypertension: Secondary | ICD-10-CM | POA: Diagnosis not present

## 2015-10-03 DIAGNOSIS — M25532 Pain in left wrist: Secondary | ICD-10-CM | POA: Diagnosis present

## 2015-10-03 DIAGNOSIS — Z79899 Other long term (current) drug therapy: Secondary | ICD-10-CM | POA: Insufficient documentation

## 2015-10-03 LAB — URINALYSIS, ROUTINE W REFLEX MICROSCOPIC
Bilirubin Urine: NEGATIVE
GLUCOSE, UA: NEGATIVE mg/dL
KETONES UR: NEGATIVE mg/dL
LEUKOCYTES UA: NEGATIVE
Nitrite: NEGATIVE
PROTEIN: NEGATIVE mg/dL
Specific Gravity, Urine: 1.009 (ref 1.005–1.030)
pH: 6 (ref 5.0–8.0)

## 2015-10-03 LAB — CBC WITH DIFFERENTIAL/PLATELET
BASOS ABS: 0 10*3/uL (ref 0.0–0.1)
BASOS PCT: 0 %
Eosinophils Absolute: 0.4 10*3/uL (ref 0.0–0.7)
Eosinophils Relative: 5 %
HEMATOCRIT: 34.8 % — AB (ref 39.0–52.0)
HEMOGLOBIN: 11.8 g/dL — AB (ref 13.0–17.0)
LYMPHS PCT: 21 %
Lymphs Abs: 2 10*3/uL (ref 0.7–4.0)
MCH: 32 pg (ref 26.0–34.0)
MCHC: 33.9 g/dL (ref 30.0–36.0)
MCV: 94.3 fL (ref 78.0–100.0)
MONO ABS: 0.7 10*3/uL (ref 0.1–1.0)
MONOS PCT: 8 %
NEUTROS PCT: 66 %
Neutro Abs: 6.1 10*3/uL (ref 1.7–7.7)
Platelets: 180 10*3/uL (ref 150–400)
RBC: 3.69 MIL/uL — ABNORMAL LOW (ref 4.22–5.81)
RDW: 15.1 % (ref 11.5–15.5)
WBC: 9.3 10*3/uL (ref 4.0–10.5)

## 2015-10-03 LAB — TROPONIN I: Troponin I: <0.03 ng/mL (ref <0.031)

## 2015-10-03 LAB — BASIC METABOLIC PANEL
ANION GAP: 8 (ref 5–15)
BUN: 35 mg/dL — ABNORMAL HIGH (ref 6–20)
CHLORIDE: 102 mmol/L (ref 101–111)
CO2: 25 mmol/L (ref 22–32)
Calcium: 9.3 mg/dL (ref 8.9–10.3)
Creatinine, Ser: 2.5 mg/dL — ABNORMAL HIGH (ref 0.61–1.24)
GFR calc non Af Amer: 24 mL/min — ABNORMAL LOW (ref >60)
GFR, EST AFRICAN AMERICAN: 28 mL/min — AB (ref >60)
GLUCOSE: 137 mg/dL — AB (ref 65–99)
Potassium: 4.6 mmol/L (ref 3.5–5.1)
Sodium: 135 mmol/L (ref 135–145)

## 2015-10-03 LAB — URINE MICROSCOPIC-ADD ON
Bacteria, UA: NONE SEEN
RBC / HPF: NONE SEEN RBC/hpf (ref 0–5)
SQUAMOUS EPITHELIAL / LPF: NONE SEEN

## 2015-10-03 LAB — CBG MONITORING, ED: Glucose-Capillary: 95 mg/dL (ref 65–99)

## 2015-10-03 LAB — I-STAT CG4 LACTIC ACID, ED: Lactic Acid, Venous: 0.89 mmol/L (ref 0.5–2.0)

## 2015-10-03 LAB — SEDIMENTATION RATE: SED RATE: 69 mm/h — AB (ref 0–16)

## 2015-10-03 MED ORDER — SODIUM CHLORIDE 0.9 % IV BOLUS (SEPSIS): 500.0000 mL | Freq: Once | INTRAVENOUS | Status: DC

## 2015-10-03 MED ORDER — SODIUM CHLORIDE 0.9 % IV SOLN
INTRAVENOUS | Status: DC
  Administered 2015-10-03: 20:00:00 via INTRAVENOUS

## 2015-10-03 MED ORDER — SODIUM CHLORIDE 0.9 % IV BOLUS (SEPSIS): 1000.0000 mL | Freq: Once | INTRAVENOUS | Status: DC

## 2015-10-03 MED ORDER — COLCHICINE 0.6 MG PO TABS: 0.6000 mg | ORAL_TABLET | Freq: Two times a day (BID) | ORAL | Status: DC

## 2015-10-03 MED ORDER — ONDANSETRON HCL 4 MG/2ML IJ SOLN
4.0000 mg | Freq: Once | INTRAMUSCULAR | Status: AC
  Administered 2015-10-03: 4 mg via INTRAVENOUS
  Filled 2015-10-03: qty 2

## 2015-10-03 MED ORDER — LIDOCAINE-EPINEPHRINE (PF) 1 %-1:200000 IJ SOLN
INTRAMUSCULAR | Status: DC
Start: 2015-10-03 — End: 2015-10-04
  Filled 2015-10-03: qty 30

## 2015-10-03 MED ORDER — LIDOCAINE-EPINEPHRINE 2 %-1:100000 IJ SOLN
20.0000 mL | Freq: Once | INTRAMUSCULAR | Status: DC
  Filled 2015-10-03: qty 20

## 2015-10-03 MED ORDER — FENTANYL CITRATE (PF) 100 MCG/2ML IJ SOLN
50.0000 ug | Freq: Once | INTRAMUSCULAR | Status: AC
  Administered 2015-10-03: 50 ug via INTRAVENOUS
  Filled 2015-10-03: qty 2

## 2015-10-03 NOTE — ED Notes (Signed)
Patient here with complaints of left wrist pain denies injury. Also reports fever of 101 at home Tylenol given. States that he is new here to the country from [place]. Was suppose to have a CABG done in April but refused. Denies chest pain and SOB currently. Tachy in triage.

## 2015-10-03 NOTE — Consult Note (Signed)
Reason for Consult:L wrist pain Referring Physician: ER  CC:I had this problem before with my wrist  HPI:  Daniel Pacheco is an 75 y.o. right handed male who presents with 1 day h/op pain L wrist, similar to his gout episode in February.  States he took 1 colchicine yesterday.  Fever this am took tylenol, fever went away, has not had it again.  Pt leaves for home in 2 days out of country.  Has MD in [place] and an appointment next week  Pain is rated at    7/10 and is described as sharp.  Pain is constant.  Pain is made better by rest/immobilization, worse with motion.   Associated signs/symptoms:h/o gout Previous treatment:  Colchicine, attempt to aspirate wrist by ER  Past Medical History  Diagnosis Date  . Hypertension   . Diabetes mellitus without complication ([place])   . Cancer [facility])   CAD Gout  Past Surgical History  Procedure Laterality Date  . Cardiac catheterization N/A 08/20/2015    Procedure: Left Heart Cath and Coronary Angiography;  Surgeon: Troy Sine, MD;  Location: [place] [facility];  Service: Cardiovascular;  Laterality: N/A;    History reviewed. No pertinent family history.  Social History:  reports that he has quit smoking. He does not have any smokeless tobacco history on file. He reports that he does not drink alcohol. His drug history is not on file.  Allergies:  Allergies  Allergen Reactions  . Nsaids     Pt has kidney problems, wants to avoid all nsaids  . Sulfa Antibiotics Rash    Medications: I have reviewed the patient's current medications.  Results for orders placed or performed during the hospital encounter of 10/03/15 (from the past 48 hour(s))  Urinalysis, Routine w reflex microscopic     Status: Abnormal   Collection Time: 10/03/15  7:52 PM  Result Value Ref Range   Color, Urine YELLOW YELLOW   APPearance CLEAR CLEAR   Specific Gravity, Urine 1.009 1.005 - 1.030   pH 6.0 5.0 - 8.0   Glucose, UA NEGATIVE NEGATIVE mg/dL   Hgb urine dipstick  TRACE (A) NEGATIVE   Bilirubin Urine NEGATIVE NEGATIVE   Ketones, ur NEGATIVE NEGATIVE mg/dL   Protein, ur NEGATIVE NEGATIVE mg/dL   Nitrite NEGATIVE NEGATIVE   Leukocytes, UA NEGATIVE NEGATIVE  Urine microscopic-add on     Status: None   Collection Time: 10/03/15  7:52 PM  Result Value Ref Range   Squamous Epithelial / LPF NONE SEEN NONE SEEN   WBC, UA 0-5 0 - 5 WBC/hpf   RBC / HPF NONE SEEN 0 - 5 RBC/hpf   Bacteria, UA NONE SEEN NONE SEEN  CBC with Differential/Platelet     Status: Abnormal   Collection Time: 10/03/15  8:10 PM  Result Value Ref Range   WBC 9.3 4.0 - 10.5 K/uL   RBC 3.69 (L) 4.22 - 5.81 MIL/uL   Hemoglobin 11.8 (L) 13.0 - 17.0 g/dL   HCT 34.8 (L) 39.0 - 52.0 %   MCV 94.3 78.0 - 100.0 fL   MCH 32.0 26.0 - 34.0 pg   MCHC 33.9 30.0 - 36.0 g/dL   RDW 15.1 11.5 - 15.5 %   Platelets 180 150 - 400 K/uL   Neutrophils Relative % 66 %   Neutro Abs 6.1 1.7 - 7.7 K/uL   Lymphocytes Relative 21 %   Lymphs Abs 2.0 0.7 - 4.0 K/uL   Monocytes Relative 8 %   Monocytes Absolute 0.7  0.1 - 1.0 K/uL   Eosinophils Relative 5 %   Eosinophils Absolute 0.4 0.0 - 0.7 K/uL   Basophils Relative 0 %   Basophils Absolute 0.0 0.0 - 0.1 K/uL  Basic metabolic panel     Status: Abnormal   Collection Time: 10/03/15  8:10 PM  Result Value Ref Range   Sodium 135 135 - 145 mmol/L   Potassium 4.6 3.5 - 5.1 mmol/L   Chloride 102 101 - 111 mmol/L   CO2 25 22 - 32 mmol/L   Glucose, Bld 137 (H) 65 - 99 mg/dL   BUN 35 (H) 6 - 20 mg/dL   Creatinine, Ser 2.50 (H) 0.61 - 1.24 mg/dL   Calcium 9.3 8.9 - 10.3 mg/dL   GFR calc non Af Amer 24 (L) >60 mL/min   GFR calc Af Amer 28 (L) >60 mL/min    Comment: (NOTE) The eGFR has been calculated using the CKD EPI equation. This calculation has not been validated in all clinical situations. eGFR's persistently <60 mL/min signify possible Chronic Kidney Disease.    Anion gap 8 5 - 15  Sedimentation rate     Status: Abnormal   Collection Time: 10/03/15   8:10 PM  Result Value Ref Range   Sed Rate 69 (H) 0 - 16 mm/hr  Troponin I     Status: None   Collection Time: 10/03/15  8:10 PM  Result Value Ref Range   Troponin I <0.03 <0.031 ng/mL    Comment:        NO INDICATION OF MYOCARDIAL INJURY.   I-Stat CG4 Lactic Acid, ED     Status: None   Collection Time: 10/03/15  8:31 PM  Result Value Ref Range   Lactic Acid, Venous 0.89 0.5 - 2.0 mmol/L    Dg Chest 2 View  10/03/2015  CLINICAL DATA:  Fever today.  Initial encounter. EXAM: CHEST  2 VIEW COMPARISON:  PA and lateral chest 08/21/2015. FINDINGS: The lungs are clear. Heart size is normal. No pneumothorax or pleural effusion. Degenerative disease about the shoulders is noted. No acute bony abnormality. IMPRESSION: No acute disease. Electronically Signed   By: Inge Rise M.D.   On: 10/03/2015 19:55   Dg Wrist Complete Left  10/03/2015  CLINICAL DATA:  Wrist pain and fever EXAM: LEFT WRIST - COMPLETE 3+ VIEW COMPARISON:  None available FINDINGS: Bones are osteopenic. Minor degenerative changes of the radiocarpal joint and the first carpometacarpal joint. No acute osseous finding, fracture, subluxation or dislocation. No osseous destruction, bone loss or periostitis. Peripheral atherosclerosis noted. No soft tissue abnormality. IMPRESSION: Osteopenia and  degenerative changes. No acute osseous finding by plain radiography. Peripheral atherosclerosis Electronically Signed   By: Jerilynn Mages.  Shick M.D.   On: 10/03/2015 20:03    Pertinent items are noted in HPI. Temp:  [99.2 F (37.3 C)] 99.2 F (37.3 C) (06/01 1708) Pulse Rate:  [102-128] 104 (06/01 2300) Resp:  [12-20] 17 (06/01 2300) BP: (146-163)/(90-103) 163/103 mmHg (06/01 2300) SpO2:  [93 %-99 %] 97 % (06/01 2300) General appearance: alert and cooperative Resp: clear to auscultation bilaterally Cardio: regular rate and rhythm GI: soft, non-tender; bowel sounds normal; no masses,  no organomegaly Extremities: L wrist with some warmth, ?  erythema(skin color) pain with wrist flex / extend, no obvoius abscess   Assessment:  L wrist swelling, probably gout flare up  Plan: Will aspirate wrist joint, pt does not want to be admitted as he is to return to his country in 2d. I  have discussed this treatment plan in detail with patient and family, including the risks of the recommended treatment or surgery, the benefits and the alternatives.  The patient and caregiver understands that additional treatment may be necessary.  Procedure: L wrist prepped sterilly, local anesthetic infiltrated into skin; wrist joint aspirated, minimal joint fluid obtained, non purulent. Plan: doubt septic joint, would treat as gout, have pt f/u with home MD .  Dayna Barker CHRISTOPHER 10/03/2015, 11:24 PM

## 2015-10-03 NOTE — ED Provider Notes (Signed)
CSN: [id number]     Arrival date & time 10/03/15  1643 History   First MD Initiated Contact with Patient 10/03/15 1909     Chief Complaint  Patient presents with  . Wrist Pain  . Fever     HPI  Daniel Pacheco is an 75 y.o. 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 He states he was in his usual state of health until last night when he noticed some vague diffuse left arm pain. States today his hand and wrist in particular are the most painful and his hand and wrist are swollen. He states he had a fever at home this afternoon of 101.67F and took tylenol. Of note pt states he would not want to stay in the hospital tonight. He was admitted in 08/2015 for NSTEMI and left prior to receiving the recommended CABG; in the ED now he states he had a poor experience then and does not want to stay tonight. He otherwise denies chest pain, SOB, nausea, vomiting. He denies injury or trauma to his arm/hand. He does endorse a history of gout and states he has had gout in the same area before; states the pain now feels very similar. Denies numbness or weakness but states it is tender to the touch and painful with movement. He is visiting his daughter in the area; lives in [place]. He has otherwise not tried anything to alleviate his symptoms.  Past Medical History  Diagnosis Date  . Hypertension   . Diabetes mellitus without complication ([place])   . Cancer [facility])    Past Surgical History  Procedure Laterality Date  . Cardiac catheterization N/A 08/20/2015    Procedure: Left Heart Cath and Coronary Angiography;  Surgeon: Troy Sine, MD;  Location: [facility];  Service: Cardiovascular;  Laterality: N/A;   History reviewed. No pertinent family history. Social History  Substance Use Topics  . Smoking status: Former Research scientist (life sciences)  . Smokeless tobacco: None  . Alcohol Use: No    Review  of Systems  All other systems reviewed and are negative.     Allergies  Nsaids and Sulfa antibiotics  Home Medications   Prior to Admission medications   Medication Sig Start Date End Date Taking? Authorizing Provider  acetaminophen (TYLENOL) 500 MG tablet Take 500 mg by mouth daily as needed for moderate pain.   Yes Historical Provider, MD  ALPRAZolam Duanne Moron) 1 MG tablet Take 0.5 mg by mouth at bedtime.   Yes Historical Provider, MD  aspirin EC 81 MG tablet Take 81 mg by mouth daily.   Yes Historical Provider, MD  atorvastatin (LIPITOR) 80 MG tablet Take 1 tablet (80 mg total) by mouth daily at 6 PM. Patient taking differently: Take 80 mg by mouth daily as needed (high cholesterol).  08/22/15  Yes Thurnell Lose, MD  gabapentin (NEURONTIN) 600 MG tablet Take 600 mg by mouth at bedtime.   Yes Historical Provider, MD  insulin glargine (LANTUS) 100 UNIT/ML injection Inject 20-45 Units into the skin at bedtime. Inject 45 units in the morning and in the evening take anywhere from 20 to 35 units depending on blood glucose levels   Yes Historical Provider, MD  isosorbide mononitrate (IMDUR) 60 MG 24 hr tablet Take 60 mg by mouth daily.   Yes Historical Provider, MD  metoprolol (LOPRESSOR) 50 MG tablet Take 50 mg by  mouth 2 (two) times daily.   Yes Historical Provider, MD  ipratropium-albuterol (DUONEB) 0.5-2.5 (3) MG/3ML SOLN Take 3 mLs by nebulization every 6 (six) hours as needed. 08/22/15   Thurnell Lose, MD  isosorbide mononitrate (IMDUR) 120 MG 24 hr tablet Take 1 tablet (120 mg total) by mouth daily. Patient not taking: Reported on 10/03/2015 08/22/15   Thurnell Lose, MD  nitroGLYCERIN (NITROSTAT) 0.4 MG SL tablet Place 1 tablet (0.4 mg total) under the tongue every 5 (five) minutes x 3 doses as needed for chest pain. 08/22/15   Thurnell Lose, MD  omeprazole (PRILOSEC OTC) 20 MG tablet Take 20 mg by mouth daily as needed (acid reflux).    Historical Provider, MD  pantoprazole  (PROTONIX) 40 MG tablet Take 2 tablets (80 mg total) by mouth daily. 08/22/15   Thurnell Lose, MD   BP 146/95 mmHg  Pulse 128  Temp(Src) 99.2 F (37.3 C) (Oral)  Resp 20  SpO2 99% Physical Exam  Constitutional: He is oriented to person, place, and time.  HENT:  Right Ear: External ear normal.  Left Ear: External ear normal.  Nose: Nose normal.  Mouth/Throat: Oropharynx is clear and moist. No oropharyngeal exudate.  Eyes: Conjunctivae and EOM are normal. Pupils are equal, round, and reactive to light.  Neck: Normal range of motion. Neck supple.  Cardiovascular: Regular rhythm, normal heart sounds and intact distal pulses.   Initial vitals in triage reveal HR of 128. On my exam he is in the low 100s.  Pulmonary/Chest: Effort normal and breath sounds normal. No respiratory distress. He has no wheezes. He exhibits no tenderness.  Abdominal: Soft. Bowel sounds are normal. He exhibits no distension. There is no tenderness. There is no rebound and no guarding.  Musculoskeletal:  Right hand and wrist is swollen and warm to the touch. His wrist is tender particularly over dorsal aspect.  Neurological: He is alert and oriented to person, place, and time. No cranial nerve deficit.  Skin: Skin is warm and dry.  Psychiatric: He has a normal mood and affect.  Nursing note and vitals reviewed.   ED Course  Procedures (including critical care time) Labs Review Labs Reviewed  CBC WITH DIFFERENTIAL/PLATELET - Abnormal; Notable for the following:    RBC 3.69 (*)    Hemoglobin 11.8 (*)    HCT 34.8 (*)    All other components within normal limits  BASIC METABOLIC PANEL - Abnormal; Notable for the following:    Glucose, Bld 137 (*)    BUN 35 (*)    Creatinine, Ser 2.50 (*)    GFR calc non Af Amer 24 (*)    GFR calc Af Amer 28 (*)    All other components within normal limits  URINALYSIS, ROUTINE W REFLEX MICROSCOPIC (NOT AT [facility]) - Abnormal; Notable for the following:    Hgb urine dipstick  TRACE (*)    All other components within normal limits  CULTURE, BLOOD (ROUTINE X 2)  CULTURE, BLOOD (ROUTINE X 2)  URINE CULTURE  URINE MICROSCOPIC-ADD ON  SEDIMENTATION RATE  I-STAT CG4 LACTIC ACID, ED  Randolm Idol, ED    Imaging Review Dg Chest 2 View  10/03/2015  CLINICAL DATA:  Fever today.  Initial encounter. EXAM: CHEST  2 VIEW COMPARISON:  PA and lateral chest 08/21/2015. FINDINGS: The lungs are clear. Heart size is normal. No pneumothorax or pleural effusion. Degenerative disease about the shoulders is noted. No acute bony abnormality. IMPRESSION: No acute disease. Electronically Signed  By: Inge Rise M.D.   On: 10/03/2015 19:55   Dg Wrist Complete Left  10/03/2015  CLINICAL DATA:  Wrist pain and fever EXAM: LEFT WRIST - COMPLETE 3+ VIEW COMPARISON:  None available FINDINGS: Bones are osteopenic. Minor degenerative changes of the radiocarpal joint and the first carpometacarpal joint. No acute osseous finding, fracture, subluxation or dislocation. No osseous destruction, bone loss or periostitis. Peripheral atherosclerosis noted. No soft tissue abnormality. IMPRESSION: Osteopenia and  degenerative changes. No acute osseous finding by plain radiography. Peripheral atherosclerosis Electronically Signed   By: Jerilynn Mages.  Shick M.D.   On: 10/03/2015 20:03   I have personally reviewed and evaluated these images and lab results as part of my medical decision-making.   EKG Interpretation   Date/Time:  Thursday October 03 2015 17:13:56 EDT Ventricular Rate:  122 PR Interval:  138 QRS Duration: 89 QT Interval:  307 QTC Calculation: 437 R Axis:   48 Text Interpretation:  Sinus tachycardia Abnormal R-wave progression, early  transition Repol abnrm suggests ischemia, anterolateral Rate increased  from previous.  Patient with continued ST depressions in V4-V6 as well as  T wave depressions in I and aVL Confirmed by NGUYEN, EMILY ([id number]) on  10/03/2015 5:18:58 PM      MDM   Final  diagnoses:  Gouty arthritis  Left wrist pain    Dr. Oleta Mouse attempted joint aspiration at bedside but unfortunately was unsuccessful. Dr. Lenon Curt was paged to come see pt at bedside. However, after long discussion via [place] language interpreter pt is opting to leave AMA. I explained the concern for possible septic joint and if he leaves now without proper evaluation and treatment he is at risk of disability or even death. He states he wants to wait until he gets back to [place] for futher evaluation. I explained to him I am worried he might die before even making it back to [place] if this is a septic joint given his fever, joint pain, and co-morbidities. He verbalized his understanding.  Dr. Oleta Mouse spoke to pt and he is now amenable to staying to see Dr. Lenon Curt in the ED. At end of shift dispo is pending his consultation and will be completed by attending physician and oncoming team.   Anne Ng, PA-C 10/04/15 [id number]  Forde Dandy, MD 10/05/15 [phone]

## 2015-10-03 NOTE — Discharge Instructions (Signed)
Return without fail for worsening symptoms, including persistent fever, vomiting and unable to keep down food/fluids, worsening pain or swelling, or any other symptoms concerning to you.  Gout Gout is when your joints become red, sore, and swell (inflamed). This is caused by the buildup of uric acid crystals in the joints. Uric acid is a chemical that is normally in the blood. If the level of uric acid gets too high in the blood, these crystals form in your joints and tissues. Over time, these crystals can form into masses near the joints and tissues. These masses can destroy bone and cause the bone to look misshapen (deformed). HOME CARE   Do not take aspirin for pain.  Only take medicine as told by your doctor.  Rest the joint as much as you can. When in bed, keep sheets and blankets off painful areas.  Keep the sore joints raised (elevated).  Put warm or cold packs on painful joints. Use of warm or cold packs depends on which works best for you.  Use crutches if the painful joint is in your leg.  Drink enough fluids to keep your pee (urine) clear or pale yellow. Limit alcohol, sugary drinks, and drinks with fructose in them.  Follow your diet instructions. Pay careful attention to how much protein you eat. Include fruits, vegetables, whole grains, and fat-free or low-fat milk products in your daily diet. Talk to your doctor or dietitian about the use of coffee, vitamin C, and cherries. These may help lower uric acid levels.  Keep a healthy body weight. GET HELP RIGHT AWAY IF:   You have watery poop (diarrhea), throw up (vomit), or have any side effects from medicines.  You do not feel better in 24 hours, or you are getting worse.  Your joint becomes suddenly more tender, and you have chills or a fever. MAKE SURE YOU:   Understand these instructions.  Will watch your condition.  Will get help right away if you are not doing well or get worse.   This information is not intended  to replace advice given to you by your health care provider. Make sure you discuss any questions you have with your health care provider.   Document Released: 01/28/2008 Document Revised: 05/11/2014 Document Reviewed: 12/02/2011 Elsevier Interactive Patient Education [employer].

## 2015-10-04 NOTE — ED Provider Notes (Signed)
Medical screening examination/treatment/procedure(s) were conducted as a shared visit with non-physician practitioner(s) and myself.  I personally evaluated the patient during the encounter.   EKG Interpretation   Date/Time:  Thursday October 03 2015 17:13:56 EDT Ventricular Rate:  122 PR Interval:  138 QRS Duration: 89 QT Interval:  307 QTC Calculation: 437 R Axis:   48 Text Interpretation:  Sinus tachycardia Abnormal R-wave progression, early  transition Repol abnrm suggests ischemia, anterolateral Rate increased  from previous.  Patient with continued ST depressions in V4-V6 as well as  T wave depressions in I and aVL Confirmed by Alfonse Spruce, EMILY ([id number]) on  10/03/2015 5:18:58 PM      ARTHOCENTESIS Performed by: Forde Dandy Consent: Verbal consent obtained. Risks and benefits: risks, benefits and alternatives were discussed Consent given by: patient Required items: required blood products, implants, devices, and special equipment available Patient identity confirmed: verbally with patient Time out: Immediately prior to procedure a "time out" was called to verify the correct patient, procedure, equipment, support staff and site/side marked as required. Ultrasound guided: yes Indications: evaluation for septic arthritis  Joint: left wrist Local anesthesia used: 2% lidocaine with epinephrine       Preparation: Patient was prepped and draped in the usual sterile fashion. Aspirate appearance: two unsuccessful attempts Aspirate amount: two unsuccessful attempts Patient tolerance: Patient tolerated the procedure well with no immediate complications.   75 year old male with history of gout, diabetes, and hypertension who presents with fever and wrist pain. Reports 1 day of fever of 101 Fahrenheit for which she took Tylenol prior to arrival. Has had progressive left wrist pain, swelling and redness. No cough, shortness of breath, chest pain, abdominal pain, nausea or vomiting, diarrhea,  dysuria or urinary frequency. Presentation is afebrile, but tachycardic. Normotensive and in no respiratory distress. Left wrist appear swollen, mildly warm to touch, and mildly erythematous. Limited range of motion due to pain. X-ray obtained, visualized and without acute processes. Septic workup also pursued, patient with normal lactate, no leukocytosis, and no major electrolyte or metabolic derangements. Urinalysis and chest x-ray without signs of infection. No other source of infection. Concern for septic arthritis, and arthrocentesis attempted of the left wrist, without success. Dr. Lenon Curt came to evaluate patient regarding septic arthritis, and did not feel that patient required arthrocentesis. Recommending outpatient management for gout. Discussed with patient as with DM, fever and wrist pain, and w/o true rule out for septic arthritis, may require observation. Requesting discharge and to leave AMA. Discussion via [place]anish interpreter obtained. HAs capacity to make decision and able to express risks of discharge. Strict return and follow-up instructions reviewed. He expressed understanding of all discharge instructions and felt comfortable with the plan of care.    Forde Dandy, MD 10/04/15 [phone]

## 2015-10-05 LAB — URINE CULTURE: CULTURE: NO GROWTH

## 2015-10-08 LAB — CULTURE, BLOOD (ROUTINE X 2)
CULTURE: NO GROWTH
Culture: NO GROWTH

## 2016-06-01 IMAGING — CR DG WRIST COMPLETE 3+V*L*
4 series · 4 of 4 positions shown · non-contrast
Comparison: None available

CLINICAL DATA: Wrist pain and fever

EXAM:
LEFT WRIST - COMPLETE 3+ VIEW

[x wrist pa left]
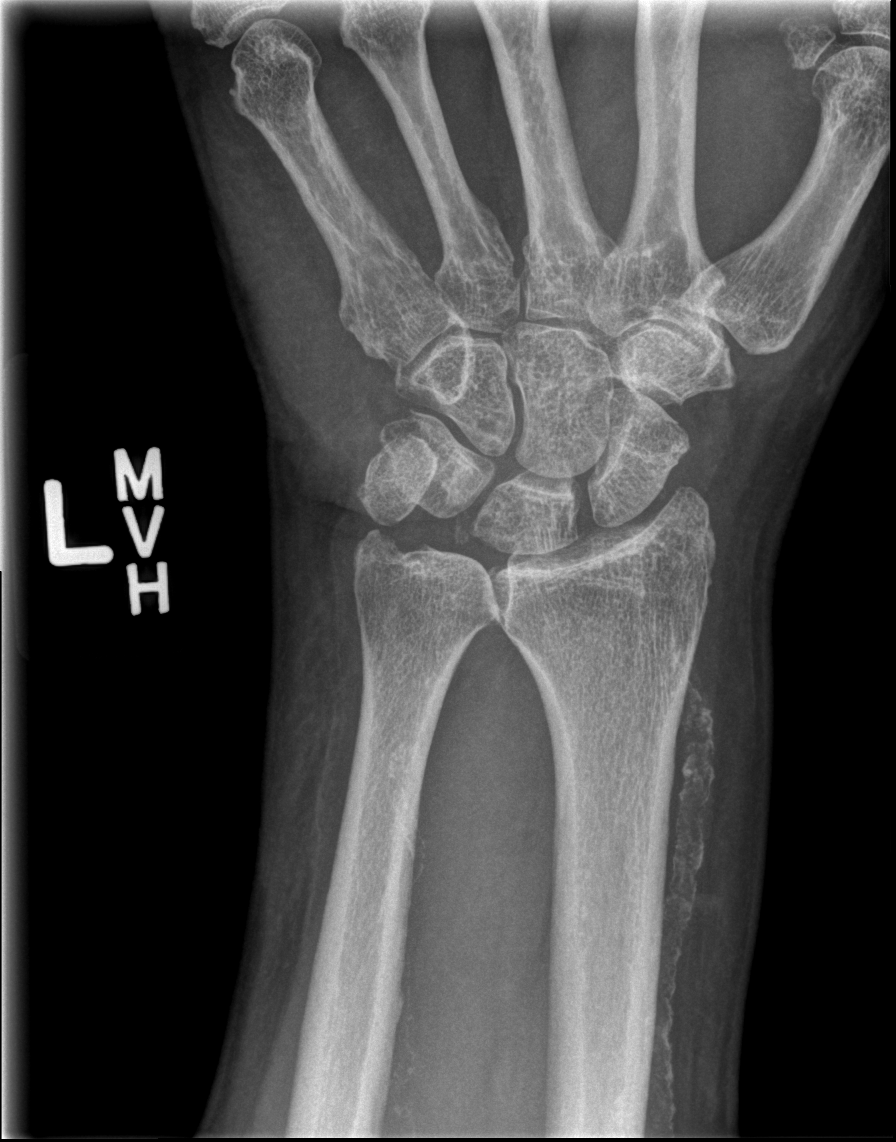

[x wrist navicular view left]
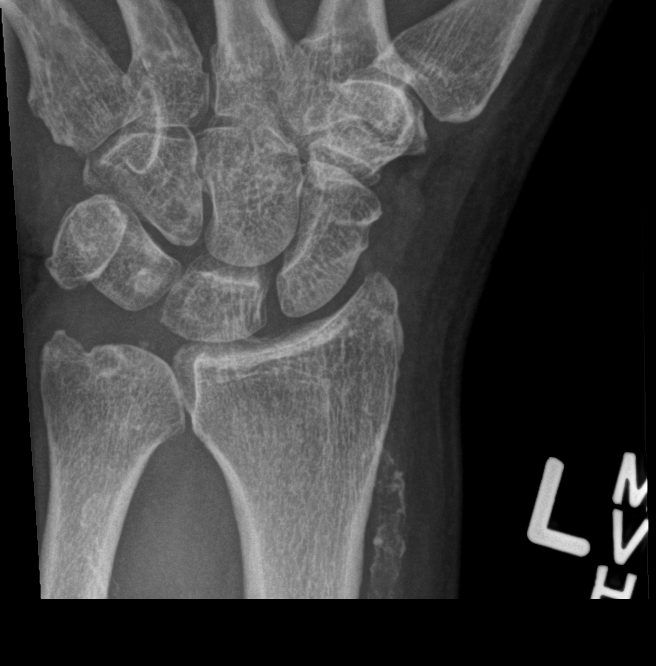

[x wrist obl left]
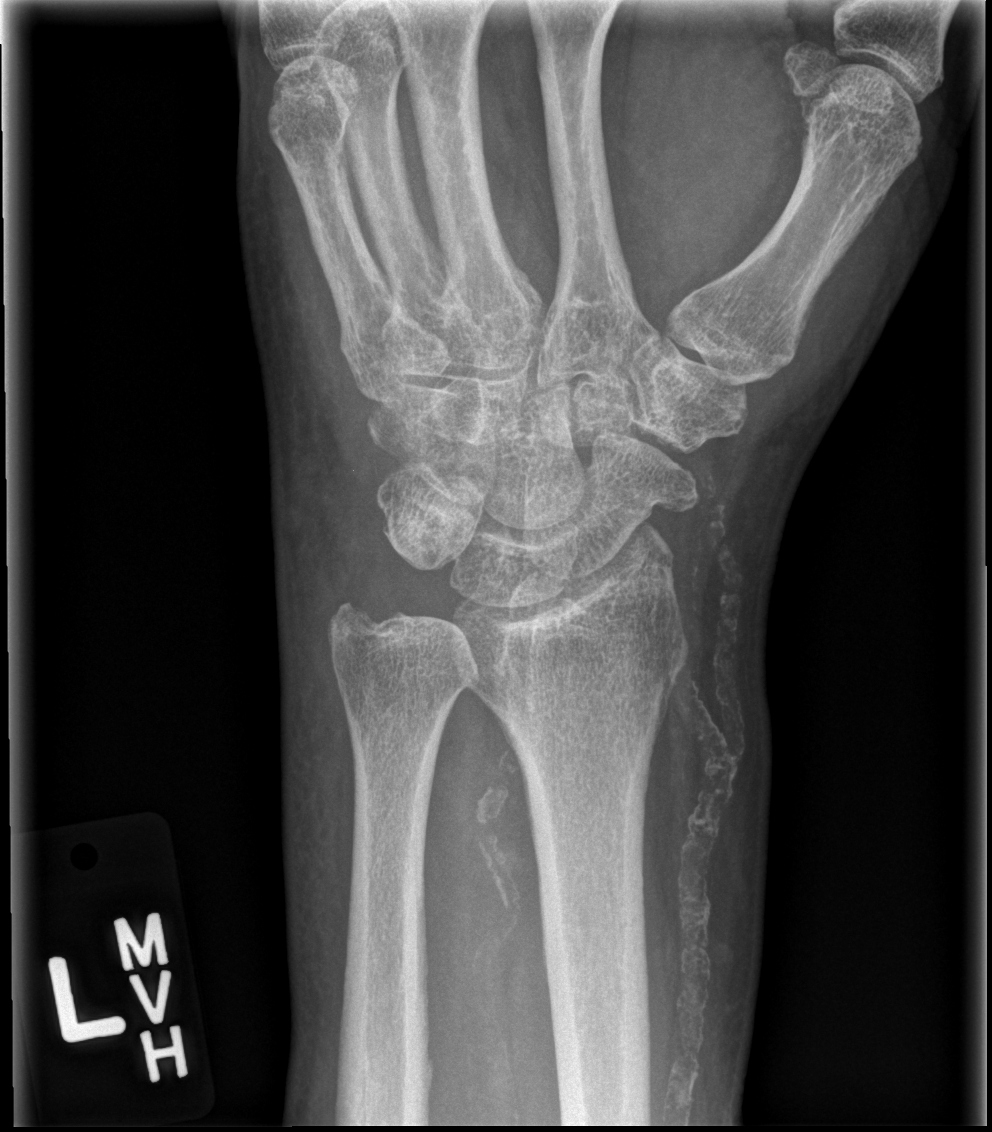

[x wrist lat left]
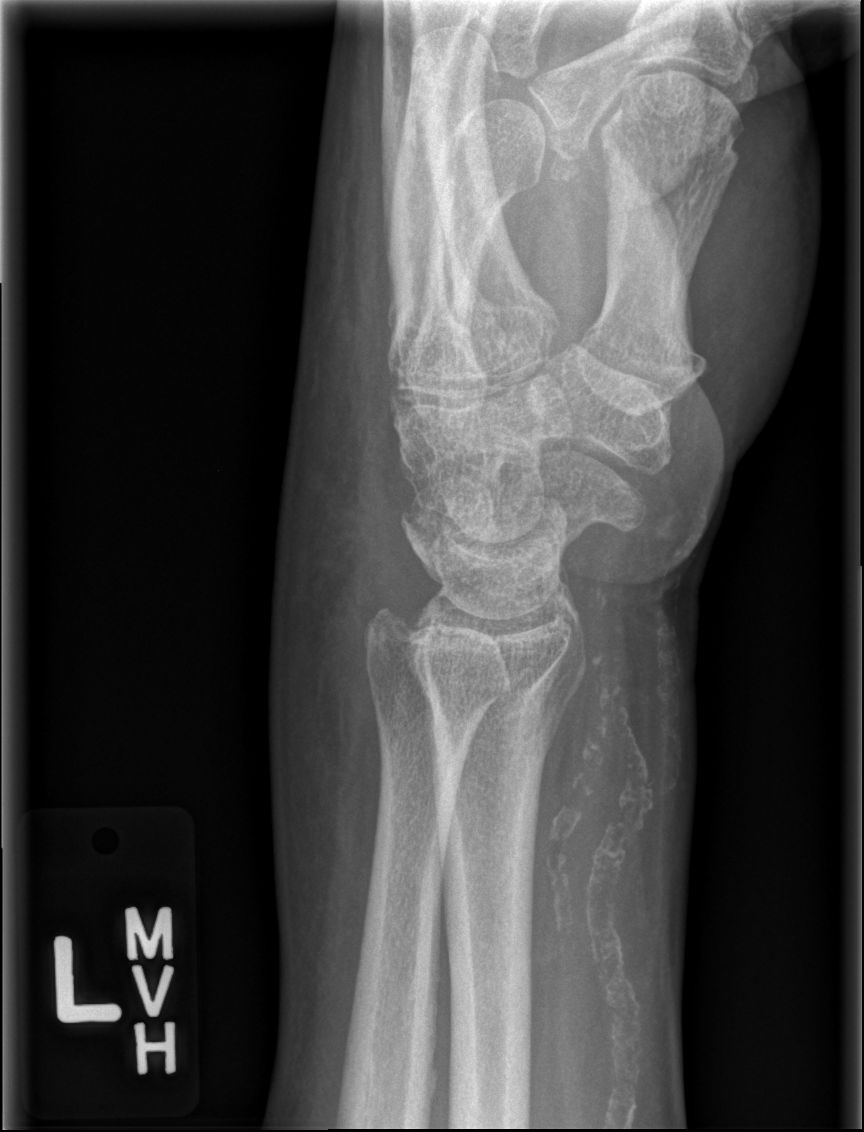

[4 of 4 positions shown; findings below may reference images not displayed]

FINDINGS: Bones are osteopenic. Minor degenerative changes of the radiocarpal
joint and the first carpometacarpal joint. No acute osseous finding,
fracture, subluxation or dislocation. No osseous destruction, bone
loss or periostitis. Peripheral atherosclerosis noted. No soft
tissue abnormality.
IMPRESSION: Osteopenia and  degenerative changes.

No acute osseous finding by plain radiography.

Peripheral atherosclerosis

## 2016-09-01 ENCOUNTER — Emergency Department (HOSPITAL_COMMUNITY)
Admission: EM | Admit: 2016-09-01 | Discharge: 2016-09-01 | Disposition: A | Discharge status: Home / Self Care | Payer: Medicare (Managed Care) | Attending: Emergency Medicine | Admitting: Emergency Medicine

## 2016-09-01 ENCOUNTER — Emergency Department (HOSPITAL_COMMUNITY): Payer: Medicare (Managed Care)

## 2016-09-01 ENCOUNTER — Encounter (HOSPITAL_COMMUNITY): Payer: Self-pay | Admitting: *Deleted

## 2016-09-01 DIAGNOSIS — I129 Hypertensive chronic kidney disease with stage 1 through stage 4 chronic kidney disease, or unspecified chronic kidney disease: Secondary | ICD-10-CM | POA: Diagnosis not present

## 2016-09-01 DIAGNOSIS — Z7982 Long term (current) use of aspirin: Secondary | ICD-10-CM | POA: Insufficient documentation

## 2016-09-01 DIAGNOSIS — J069 Acute upper respiratory infection, unspecified: Secondary | ICD-10-CM | POA: Diagnosis not present

## 2016-09-01 DIAGNOSIS — E1122 Type 2 diabetes mellitus with diabetic chronic kidney disease: Secondary | ICD-10-CM | POA: Insufficient documentation

## 2016-09-01 DIAGNOSIS — Z794 Long term (current) use of insulin: Secondary | ICD-10-CM | POA: Diagnosis not present

## 2016-09-01 DIAGNOSIS — I2510 Atherosclerotic heart disease of native coronary artery without angina pectoris: Secondary | ICD-10-CM | POA: Diagnosis not present

## 2016-09-01 DIAGNOSIS — Z79899 Other long term (current) drug therapy: Secondary | ICD-10-CM | POA: Insufficient documentation

## 2016-09-01 DIAGNOSIS — Z8546 Personal history of malignant neoplasm of prostate: Secondary | ICD-10-CM | POA: Diagnosis not present

## 2016-09-01 DIAGNOSIS — Z87891 Personal history of nicotine dependence: Secondary | ICD-10-CM | POA: Diagnosis not present

## 2016-09-01 DIAGNOSIS — I252 Old myocardial infarction: Secondary | ICD-10-CM | POA: Diagnosis not present

## 2016-09-01 DIAGNOSIS — Z955 Presence of coronary angioplasty implant and graft: Secondary | ICD-10-CM | POA: Diagnosis not present

## 2016-09-01 DIAGNOSIS — R05 Cough: Secondary | ICD-10-CM | POA: Diagnosis present

## 2016-09-01 DIAGNOSIS — N189 Chronic kidney disease, unspecified: Secondary | ICD-10-CM | POA: Diagnosis not present

## 2016-09-01 MED ORDER — DM-GUAIFENESIN ER 30-600 MG PO TB12: 1.0000 | ORAL_TABLET | Freq: Two times a day (BID) | ORAL | 0 refills | Status: DC | PRN

## 2016-09-01 MED ORDER — FLUTICASONE PROPIONATE 50 MCG/ACT NA SUSP: 2.0000 | Freq: Every day | NASAL | 0 refills | Status: DC

## 2016-09-01 MED ORDER — BENZONATATE 100 MG PO CAPS: 100.0000 mg | ORAL_CAPSULE | Freq: Three times a day (TID) | ORAL | 0 refills | Status: DC | PRN

## 2016-09-01 MED ORDER — PREDNISONE 10 MG PO TABS: 40.0000 mg | ORAL_TABLET | Freq: Every day | ORAL | 0 refills | Status: AC

## 2016-09-01 MED ORDER — IPRATROPIUM-ALBUTEROL 0.5-2.5 (3) MG/3ML IN SOLN
3.0000 mL | Freq: Once | RESPIRATORY_TRACT | Status: AC
  Administered 2016-09-01: 3 mL via RESPIRATORY_TRACT
  Filled 2016-09-01: qty 3

## 2016-09-01 NOTE — ED Provider Notes (Signed)
[facility] DEPT Provider Note   CSN: [id number] Arrival date & time: 09/01/16  1143     History   Chief Complaint Chief Complaint  Patient presents with  . Dizziness  . Nasal Congestion    HPI Daniel Pacheco is a 76 y.o. male with PMHx of DM, HTN, NSTEMI presents today with complaints of productive cough for 1-2 weeks.  He reports associated wheezing, dizziness, nausea and the mornings. He states his symptoms are worse in the morning and at night and relieved throughout the day. He states he has used over-the-counter medications for cough with no relief. He states that his symptoms have been unchanged for the last 1-2 weeks was more concerned about his wheezing. He admits to living in [place] and coming to visit here. He denies any other trauma [place] or [place]. He denies any history of COPD or asthma. He does admit to being a former smoker. He denies fever, chills, chest pain, shortness of breath vomiting, diarrhea, abdominal pain.   The history is provided by the patient. The history is limited by a language barrier. No language interpreter was used.  Dizziness  Associated symptoms: nausea   Associated symptoms: no chest pain, no diarrhea, no shortness of breath and no vomiting     Past Medical History:  Diagnosis Date  . Cancer ([place])   . Diabetes mellitus without complication ([place])   . Hypertension   . Renal disorder     Patient Active Problem List   Diagnosis Date Noted  . CAD (coronary artery disease)   . Elevated troponin   . NSTEMI (non-ST elevated myocardial infarction) ([place]) 08/16/2015  . SOB (shortness of breath) 08/15/2015  . Insulin dependent diabetes mellitus ([place]) 08/15/2015  . Essential hypertension 08/15/2015  . CKD (chronic kidney disease) 08/15/2015  . Normocytic anemia 08/15/2015  . H/O prostate cancer 08/15/2015  . Left shoulder pain 08/15/2015  . NSTEMI, initial episode of care ([place]) 08/15/2015  . Bronchospasm 08/15/2015  . Respiratory  distress 08/15/2015    Past Surgical History:  Procedure Laterality Date  . CARDIAC CATHETERIZATION N/A 08/20/2015   Procedure: Left Heart Cath and Coronary Angiography;  Surgeon: Troy Sine, MD;  Location: [facility];  Service: Cardiovascular;  Laterality: N/A;       Home Medications    Prior to Admission medications   Medication Sig Start Date End Date Taking? Authorizing Provider  acetaminophen (TYLENOL) 500 MG tablet Take 500 mg by mouth daily as needed for moderate pain.   Yes Historical Provider, MD  ALPRAZolam Duanne Moron) 1 MG tablet Take 0.5 mg by mouth at bedtime.   Yes Historical Provider, MD  aspirin EC 81 MG tablet Take 81 mg by mouth daily.   Yes Historical Provider, MD  atorvastatin (LIPITOR) 80 MG tablet Take 1 tablet (80 mg total) by mouth daily at 6 PM. Patient taking differently: Take 80 mg by mouth daily as needed (high cholesterol).  08/22/15  Yes Thurnell Lose, MD  gabapentin (NEURONTIN) 600 MG tablet Take 600 mg by mouth at bedtime.   Yes Historical Provider, MD  insulin glargine (LANTUS) 100 UNIT/ML injection Inject 20-45 Units into the skin at bedtime. Inject 45 units in the morning and in the evening take anywhere from 20 to 35 units depending on blood glucose levels   Yes Historical Provider, MD  isosorbide mononitrate (IMDUR) 60 MG 24 hr tablet Take 60 mg by mouth daily.   Yes Historical Provider, MD  metoprolol (LOPRESSOR) 50 MG  tablet Take 50 mg by mouth 2 (two) times daily.   Yes Historical Provider, MD  nitroGLYCERIN (NITROSTAT) 0.4 MG SL tablet Place 1 tablet (0.4 mg total) under the tongue every 5 (five) minutes x 3 doses as needed for chest pain. 08/22/15  Yes Thurnell Lose, MD  omeprazole (PRILOSEC OTC) 20 MG tablet Take 20 mg by mouth daily as needed (acid reflux).   Yes Historical Provider, MD  benzonatate (TESSALON) 100 MG capsule Take 1 capsule (100 mg total) by mouth 3 (three) times daily as needed for cough. 09/01/16   Daniel Manuel  Daniel Pacheco, [place]  colchicine 0.6 MG tablet Take 1 tablet (0.6 mg total) by mouth 2 (two) times daily. Take until gout clears Patient not taking: Reported on 09/01/2016 10/03/15   Daniel Dandy, MD  dextromethorphan-guaiFENesin [facility] DM) 30-600 MG 12hr tablet Take 1 tablet by mouth 2 (two) times daily as needed for cough. 09/01/16   Daniel Manuel Daniel Pacheco, [place]  fluticasone (FLONASE) 50 MCG/ACT nasal spray Place 2 sprays into both nostrils daily. 09/01/16   Daniel Manuel [place], [place]  ipratropium-albuterol (DUONEB) 0.5-2.5 (3) MG/3ML SOLN Take 3 mLs by nebulization every 6 (six) hours as needed. Patient not taking: Reported on 09/01/2016 08/22/15   Thurnell Lose, MD  isosorbide mononitrate (IMDUR) 120 MG 24 hr tablet Take 1 tablet (120 mg total) by mouth daily. Patient not taking: Reported on 10/03/2015 08/22/15   Thurnell Lose, MD  pantoprazole (PROTONIX) 40 MG tablet Take 2 tablets (80 mg total) by mouth daily. Patient not taking: Reported on 09/01/2016 08/22/15   Thurnell Lose, MD  predniSONE (DELTASONE) 10 MG tablet Take 4 tablets (40 mg total) by mouth daily with breakfast. 09/01/16 09/06/16  [name], [place]    Family History No family history on file.  Social History Social History  Substance Use Topics  . Smoking status: Former Research scientist (life sciences)  . Smokeless tobacco: Not on file  . Alcohol use No     Allergies   Nsaids and Sulfa antibiotics   Review of Systems Review of Systems  Constitutional: Negative for chills and fever.  HENT: Positive for congestion and rhinorrhea.   Respiratory: Positive for cough and wheezing. Negative for shortness of breath.   Cardiovascular: Negative for chest pain.  Gastrointestinal: Positive for nausea. Negative for abdominal pain, diarrhea and vomiting.  Genitourinary: Negative for difficulty urinating and dysuria.  Neurological: Positive for dizziness.     Physical Exam Updated Vital Signs BP 136/79 (BP Location: Left Arm)   Pulse 92   Temp 97.5 F  (36.4 C) (Oral)   Resp 20   Ht 5\' 5"  (1.651 m)   Wt 77.1 kg   SpO2 100%   BMI 28.29 kg/m   Physical Exam  Constitutional: He is oriented to person, place, and time. He appears well-developed and well-nourished.  Well appearing. Airway patent. No stridor, no trismus. Handling secretions well.  HENT:  Head: Normocephalic and atraumatic.  Right Ear: External ear normal.  Left Ear: External ear normal.  Nose: Nose normal.  Mouth/Throat: Oropharynx is clear and moist. No oropharyngeal exudate.  Oropharynx without evidence of redness or exudates. Tonsils without evidence of redness, swelling, or exudates. TM's appear normal with no evidence of bulging. EAC appear non erythematous and not swollen  Eyes: EOM are normal. Pupils are equal, round, and reactive to light.  Neck: Normal range of motion.  Normal ROM. No nuchal rigidity.   Cardiovascular: Normal rate, normal heart sounds and intact distal  pulses.   No murmur heard. Pulmonary/Chest: Effort normal. No respiratory distress. He has wheezes. He has no rales.  Wheezing noted. No rales. No stridor. Normal work of breathing  Abdominal: Soft. There is no tenderness. There is no rebound and no guarding.  Soft and nontender. No rebound. No guarding. Negative murphy's sign. No focal tenderness at McBurney's point. No CVA tenderness. No evidence of hernia  Neurological: He is alert and oriented to person, place, and time.  Skin: Skin is warm.  Psychiatric: He has a normal mood and affect. His behavior is normal.  Nursing note and vitals reviewed.    ED Treatments / Results  Labs (all labs ordered are listed, but only abnormal results are displayed) Labs Reviewed - No data to display  EKG  EKG Interpretation None       Radiology Dg Chest 2 View  Result Date: 09/01/2016 CLINICAL DATA:  Wheezing 1 week with productive cough and dizziness. EXAM: CHEST  2 VIEW COMPARISON:  10/03/2015 FINDINGS: Lungs are adequately inflated without  focal consolidation or effusion. Cardiomediastinal silhouette is within normal. There is calcified plaque over the thoracic aorta. There are mild degenerate changes of the spine. IMPRESSION: No acute cardiopulmonary disease. Aortic atherosclerosis. Electronically Signed   By: Marin Olp M.D.   On: 09/01/2016 13:56    Procedures Procedures (including critical care time)  Medications Ordered in ED Medications  ipratropium-albuterol (DUONEB) 0.5-2.5 (3) MG/3ML nebulizer solution 3 mL (3 mLs Nebulization Given 09/01/16 1357)     Initial Impression / Assessment and Plan / ED Course  I have reviewed the triage vital signs and the nursing notes.  Pertinent labs & imaging results that were available during my care of the patient were reviewed by me and considered in my medical decision making (see chart for details).    Pt CXR negative for acute infiltrate. Patients symptoms are consistent with URI, likely viral etiology.  On exam, pt in NAD. Hemodynamically stable. No hypoxia. Afebrile. Lungs with wheezing throughout.  Heart sounds clear. Normal work of breathing. TMs clear. Throat benign. Abdomen nontender/soft. Discussed that antibiotics are not indicated for viral infections. Patient given a treatment here and felt better. Pt will be discharged with symptomatic treatment.  Verbalizes understanding and is agreeable with plan. Pt is hemodynamically stable & in NAD prior to dc. Patient also seen and evaluated by Dr. Venora Maples who agree with assessment and plan.    Final Clinical Impressions(s) / ED Diagnoses   Final diagnoses:  Viral upper respiratory tract infection    New Prescriptions Discharge Medication List as of 09/01/2016  2:50 PM    START taking these medications   Details  benzonatate (TESSALON) 100 MG capsule Take 1 capsule (100 mg total) by mouth 3 (three) times daily as needed for cough., Starting Tue 09/01/2016, Print    dextromethorphan-guaiFENesin (MUCINEX DM) 30-600 MG 12hr  tablet Take 1 tablet by mouth 2 (two) times daily as needed for cough., Starting Tue 09/01/2016, Print    fluticasone (FLONASE) 50 MCG/ACT nasal spray Place 2 sprays into both nostrils daily., Starting Tue 09/01/2016, Print    predniSONE (DELTASONE) 10 MG tablet Take 4 tablets (40 mg total) by mouth daily with breakfast., Starting Tue 09/01/2016, Until Sun 09/06/2016, [place], [place] 09/01/16 [place], MD 09/01/16 [phone]

## 2016-09-01 NOTE — Discharge Instructions (Signed)
1. Medications: flonase, mucinex, tessalon, prednisone usual home medications    Please take prednisone 40 mg for 5 days. Please take as prescribed. Do not skip any days. Take with breakfast every day. 2. Treatment: rest, drink plenty of fluids, take tylenol or ibuprofen for fever control 3. Follow Up: Please followup with your primary doctor in 3 days for discussion of your diagnoses and further evaluation after today's visit; if you do not have a primary care doctor use the resource guide provided to find one; Return to the ER for high fevers, difficulty breathing or other concerning symptoms   Get help right away if: You have severe or persistent: Headache. Ear pain. Sinus pain. Chest pain. You have chronic lung disease and any of the following: Wheezing. Prolonged cough. Coughing up blood. A change in your usual mucus. You have a stiff neck. You have changes in your: Vision. Hearing. Thinking. Mood.

## 2016-09-01 NOTE — ED Notes (Signed)
ED Provider at bedside. 

## 2016-09-01 NOTE — ED Notes (Signed)
Patient transported to X-ray 

## 2016-09-01 NOTE — ED Triage Notes (Signed)
Pt reports a wheezing sound in his throat for the past week along with a cough and phlegm. Pt states he was dizzy this morning.  Pt a/o x and and ambulatory.  Pt hx of heart problems and is visiting from [place].  Pt has been here for a week.  Pt talking in complete sentences without any issues.

## 2016-10-22 ENCOUNTER — Encounter (HOSPITAL_COMMUNITY): Payer: Self-pay

## 2016-10-22 ENCOUNTER — Emergency Department (HOSPITAL_COMMUNITY)
Admission: EM | Admit: 2016-10-22 | Discharge: 2016-10-22 | Disposition: A | Discharge status: Home / Self Care | Payer: Medicare (Managed Care) | Attending: Emergency Medicine | Admitting: Emergency Medicine

## 2016-10-22 ENCOUNTER — Emergency Department (HOSPITAL_COMMUNITY): Payer: Medicare (Managed Care)

## 2016-10-22 DIAGNOSIS — I2510 Atherosclerotic heart disease of native coronary artery without angina pectoris: Secondary | ICD-10-CM | POA: Insufficient documentation

## 2016-10-22 DIAGNOSIS — E1122 Type 2 diabetes mellitus with diabetic chronic kidney disease: Secondary | ICD-10-CM | POA: Insufficient documentation

## 2016-10-22 DIAGNOSIS — N189 Chronic kidney disease, unspecified: Secondary | ICD-10-CM | POA: Diagnosis not present

## 2016-10-22 DIAGNOSIS — Z8546 Personal history of malignant neoplasm of prostate: Secondary | ICD-10-CM | POA: Diagnosis not present

## 2016-10-22 DIAGNOSIS — Z7982 Long term (current) use of aspirin: Secondary | ICD-10-CM | POA: Insufficient documentation

## 2016-10-22 DIAGNOSIS — I252 Old myocardial infarction: Secondary | ICD-10-CM | POA: Insufficient documentation

## 2016-10-22 DIAGNOSIS — I509 Heart failure, unspecified: Secondary | ICD-10-CM | POA: Diagnosis not present

## 2016-10-22 DIAGNOSIS — R0602 Shortness of breath: Secondary | ICD-10-CM | POA: Diagnosis present

## 2016-10-22 DIAGNOSIS — Z87891 Personal history of nicotine dependence: Secondary | ICD-10-CM | POA: Insufficient documentation

## 2016-10-22 DIAGNOSIS — Z794 Long term (current) use of insulin: Secondary | ICD-10-CM | POA: Diagnosis not present

## 2016-10-22 DIAGNOSIS — I130 Hypertensive heart and chronic kidney disease with heart failure and stage 1 through stage 4 chronic kidney disease, or unspecified chronic kidney disease: Secondary | ICD-10-CM | POA: Diagnosis not present

## 2016-10-22 HISTORY — DX: Non-ST elevation (NSTEMI) myocardial infarction: I21.4

## 2016-10-22 HISTORY — DX: Heart failure, unspecified: I50.9

## 2016-10-22 HISTORY — DX: Atherosclerotic heart disease of native coronary artery without angina pectoris: I25.10

## 2016-10-22 HISTORY — DX: Gout, unspecified: M10.9

## 2016-10-22 HISTORY — DX: Chronic kidney disease, unspecified: N18.9

## 2016-10-22 LAB — BASIC METABOLIC PANEL
ANION GAP: 6 (ref 5–15)
BUN: 39 mg/dL — ABNORMAL HIGH (ref 6–20)
CHLORIDE: 106 mmol/L (ref 101–111)
CO2: 29 mmol/L (ref 22–32)
Calcium: 9.3 mg/dL (ref 8.9–10.3)
Creatinine, Ser: 2.76 mg/dL — ABNORMAL HIGH (ref 0.61–1.24)
GFR calc non Af Amer: 21 mL/min — ABNORMAL LOW (ref >60)
GFR, EST AFRICAN AMERICAN: 24 mL/min — AB (ref >60)
Glucose, Bld: 119 mg/dL — ABNORMAL HIGH (ref 65–99)
POTASSIUM: 4.8 mmol/L (ref 3.5–5.1)
Sodium: 141 mmol/L (ref 135–145)

## 2016-10-22 LAB — TROPONIN I

## 2016-10-22 LAB — CBC WITH DIFFERENTIAL/PLATELET
BASOS ABS: 0 10*3/uL (ref 0.0–0.1)
Basophils Relative: 1 %
EOS PCT: 10 %
Eosinophils Absolute: 0.7 10*3/uL (ref 0.0–0.7)
HCT: 38.5 % — ABNORMAL LOW (ref 39.0–52.0)
HEMOGLOBIN: 13 g/dL (ref 13.0–17.0)
LYMPHS PCT: 25 %
Lymphs Abs: 1.7 10*3/uL (ref 0.7–4.0)
MCH: 32.9 pg (ref 26.0–34.0)
MCHC: 33.8 g/dL (ref 30.0–36.0)
MCV: 97.5 fL (ref 78.0–100.0)
Monocytes Absolute: 0.3 10*3/uL (ref 0.1–1.0)
Monocytes Relative: 5 %
NEUTROS PCT: 59 %
Neutro Abs: 4.1 10*3/uL (ref 1.7–7.7)
Platelets: 148 10*3/uL — ABNORMAL LOW (ref 150–400)
RBC: 3.95 MIL/uL — AB (ref 4.22–5.81)
RDW: 15.2 % (ref 11.5–15.5)
WBC: 7 10*3/uL (ref 4.0–10.5)

## 2016-10-22 LAB — BRAIN NATRIURETIC PEPTIDE: B Natriuretic Peptide: 238.2 pg/mL — ABNORMAL HIGH (ref 0.0–100.0)

## 2016-10-22 MED ORDER — IPRATROPIUM BROMIDE 0.02 % IN SOLN
0.5000 mg | Freq: Once | RESPIRATORY_TRACT | Status: AC
  Administered 2016-10-22: 0.5 mg via RESPIRATORY_TRACT
  Filled 2016-10-22: qty 2.5

## 2016-10-22 MED ORDER — LEVALBUTEROL HCL 0.63 MG/3ML IN NEBU
0.6300 mg | INHALATION_SOLUTION | Freq: Once | RESPIRATORY_TRACT | Status: AC
  Administered 2016-10-22: 0.63 mg via RESPIRATORY_TRACT
  Filled 2016-10-22: qty 3

## 2016-10-22 MED ORDER — ALBUTEROL SULFATE (2.5 MG/3ML) 0.083% IN NEBU: 5.0000 mg | INHALATION_SOLUTION | Freq: Once | RESPIRATORY_TRACT | Status: DC

## 2016-10-22 MED ORDER — FUROSEMIDE 10 MG/ML IJ SOLN
20.0000 mg | Freq: Once | INTRAMUSCULAR | Status: AC
  Administered 2016-10-22: 20 mg via INTRAVENOUS
  Filled 2016-10-22: qty 4

## 2016-10-22 NOTE — ED Triage Notes (Signed)
Patient c/o chest congestion and a productive cough x 1 week.

## 2016-10-22 NOTE — ED Provider Notes (Signed)
[facility] DEPT Provider Note   CSN: [id number] Arrival date & time: 10/22/16  1135     History   Chief Complaint Chief Complaint  Patient presents with  . Cough  . Wheezing  . Shortness of Breath    HPI Daniel Pacheco is a 76 y.o. male.  HPI Pt was seen at 1245. Per pt and his family, c/o gradual onset and worsening of persistent SOB, cough, and "wheezing" for the past 1 week. SOB worsens with laying flat. Has been associated with orthopnea for the past few nights. Denies CP/palpitations, no back pain, no abd pain, no N/V/D, no fevers.   Past Medical History:  Diagnosis Date  . Cancer ([place])   . CHF (congestive heart failure) ([place])   . CKD (chronic kidney disease)   . Coronary artery disease 08/2015   multivessel disease, refused CABG  . Diabetes mellitus without complication ([place])   . Gout   . Hypertension   . NSTEMI (non-ST elevation myocardial infarction) ([place]) 08/2015   presented with wheezing    Patient Active Problem List   Diagnosis Date Noted  . CAD (coronary artery disease)   . Elevated troponin   . NSTEMI (non-ST elevated myocardial infarction) ([place]) 08/16/2015  . SOB (shortness of breath) 08/15/2015  . Insulin dependent diabetes mellitus ([place]) 08/15/2015  . Essential hypertension 08/15/2015  . CKD (chronic kidney disease) 08/15/2015  . Normocytic anemia 08/15/2015  . H/O prostate cancer 08/15/2015  . Left shoulder pain 08/15/2015  . NSTEMI, initial episode of care ([place]) 08/15/2015  . Bronchospasm 08/15/2015  . Respiratory distress 08/15/2015    Past Surgical History:  Procedure Laterality Date  . CARDIAC CATHETERIZATION N/A 08/20/2015   Procedure: Left Heart Cath and Coronary Angiography;  Surgeon: Troy Sine, MD;  Location: [facility];  Service: Cardiovascular;  Laterality: N/A;       Home Medications    Prior to Admission medications   Medication Sig Start Date End Date Taking? Authorizing Provider  acetaminophen  (TYLENOL) 500 MG tablet Take 500 mg by mouth daily as needed for moderate pain.    [provider]  ALPRAZolam Duanne Moron) 1 MG tablet Take 0.5 mg by mouth at bedtime.    [provider]  aspirin EC 81 MG tablet Take 81 mg by mouth daily.    [provider]  atorvastatin (LIPITOR) 80 MG tablet Take 1 tablet (80 mg total) by mouth daily at 6 PM. Patient taking differently: Take 80 mg by mouth daily as needed (high cholesterol).  08/22/15   Thurnell Lose, MD  benzonatate (TESSALON) 100 MG capsule Take 1 capsule (100 mg total) by mouth 3 (three) times daily as needed for cough. 09/01/16   [name], PA  colchicine 0.6 MG tablet Take 1 tablet (0.6 mg total) by mouth 2 (two) times daily. Take until gout clears Patient not taking: Reported on 09/01/2016 10/03/15   Forde Dandy, MD  dextromethorphan-guaiFENesin [facility] DM) 30-600 MG 12hr tablet Take 1 tablet by mouth 2 (two) times daily as needed for cough. 09/01/16   [place], Kemper Durie, PA  fluticasone (FLONASE) 50 MCG/ACT nasal spray Place 2 sprays into both nostrils daily. 09/01/16   [name], PA  gabapentin (NEURONTIN) 600 MG tablet Take 600 mg by mouth at bedtime.    [provider]  insulin glargine (LANTUS) 100 UNIT/ML injection Inject 20-45 Units into the skin at bedtime. Inject 45 units in the morning and in the evening take anywhere from  20 to 35 units depending on blood glucose levels    [provider]  ipratropium-albuterol (DUONEB) 0.5-2.5 (3) MG/3ML SOLN Take 3 mLs by nebulization every 6 (six) hours as needed. Patient not taking: Reported on 09/01/2016 08/22/15   Thurnell Lose, MD  isosorbide mononitrate (IMDUR) 120 MG 24 hr tablet Take 1 tablet (120 mg total) by mouth daily. Patient not taking: Reported on 10/03/2015 08/22/15   Thurnell Lose, MD  isosorbide mononitrate (IMDUR) 60 MG 24 hr tablet Take 60 mg by mouth daily.    [provider]  metoprolol  (LOPRESSOR) 50 MG tablet Take 50 mg by mouth 2 (two) times daily.    [provider]  nitroGLYCERIN (NITROSTAT) 0.4 MG SL tablet Place 1 tablet (0.4 mg total) under the tongue every 5 (five) minutes x 3 doses as needed for chest pain. 08/22/15   Thurnell Lose, MD  omeprazole (PRILOSEC OTC) 20 MG tablet Take 20 mg by mouth daily as needed (acid reflux).    [provider]  pantoprazole (PROTONIX) 40 MG tablet Take 2 tablets (80 mg total) by mouth daily. Patient not taking: Reported on 09/01/2016 08/22/15   Thurnell Lose, MD    Family History Family History  Problem Relation Age of Onset  . Family history unknown: Yes    Social History Social History  Substance Use Topics  . Smoking status: Former Research scientist (life sciences)  . Smokeless tobacco: Never Used  . Alcohol use No     Allergies   Nsaids and Sulfa antibiotics   Review of Systems Review of Systems ROS: Statement: All systems negative except as marked or noted in the HPI; Constitutional: Negative for fever and chills. ; ; Eyes: Negative for eye pain, redness and discharge. ; ; ENMT: Negative for ear pain, hoarseness, nasal congestion, sinus pressure and sore throat. ; ; Cardiovascular: Negative for chest pain, palpitations, diaphoresis, and peripheral edema. ; ; Respiratory: +cough, wheezing, SOB. Negative for stridor. ; ; Gastrointestinal: Negative for nausea, vomiting, diarrhea, abdominal pain, blood in stool, hematemesis, jaundice and rectal bleeding. . ; ; Genitourinary: Negative for dysuria, flank pain and hematuria. ; ; Musculoskeletal: Negative for back pain and neck pain. Negative for swelling and trauma.; ; Skin: Negative for pruritus, rash, abrasions, blisters, bruising and skin lesion.; ; Neuro: Negative for headache, lightheadedness and neck stiffness. Negative for weakness, altered level of consciousness, altered mental status, extremity weakness, paresthesias, involuntary movement, seizure and syncope.       Physical Exam Updated Vital Signs BP (!) 148/94   Pulse 100   Temp 98.4 F (36.9 C) (Oral)   Resp 17   Ht 5\' 5"  (1.651 m)   Wt 76.7 kg (169 lb)   SpO2 97%   BMI 28.12 kg/m    BP (!) 147/88 (BP Location: Left Arm)   Pulse 82   Temp 98.4 F (36.9 C) (Oral)   Resp 16   Ht 5\' 5"  (1.651 m)   Wt 76.7 kg (169 lb)   SpO2 97%   BMI 28.12 kg/m    Physical Exam 1250: Physical examination:  Nursing notes reviewed; Vital signs and O2 SAT reviewed;  Constitutional: Well developed, Well nourished, Well hydrated, In no acute distress; Head:  Normocephalic, atraumatic; Eyes: EOMI, PERRL, No scleral icterus; ENMT: Mouth and pharynx normal, Mucous membranes moist; Neck: Supple, Full range of motion, No lymphadenopathy; Cardiovascular: Regular rate and rhythm, No gallop; Respiratory: Breath sounds coarse & equal bilaterally, scattered wheezes. No audible wheezing. +moist cough during  exam.  Speaking full sentences with ease, Normal respiratory effort/excursion; Chest: Nontender, Movement normal; Abdomen: Soft, Nontender, Nondistended, Normal bowel sounds; Genitourinary: No CVA tenderness; Extremities: Pulses normal, No tenderness, No edema, No calf edema or asymmetry.; Neuro: AA&Ox3, Major CN grossly intact.  Speech clear. No gross focal motor or sensory deficits in extremities.; Skin: Color normal, Warm, Dry.    ED Treatments / Results  Labs (all labs ordered are listed, but only abnormal results are displayed)   EKG  EKG Interpretation  Date/Time:  Thursday October 22 2016 12:11:48 EDT Ventricular Rate:  98 PR Interval:    QRS Duration: 91 QT Interval:  342 QTC Calculation: 437 R Axis:   50 Text Interpretation:  Age not entered, assumed to be  76 years old for purpose of ECG interpretation Sinus rhythm Abnormal R-wave progression, early transition Repol abnrm suggests ischemia, anterolateral When compared with ECG of 10/03/2015 T wave abnormality Lateral leads is now Present Confirmed  by [facility]  MD, Nunzio Cory [id number]) on 10/22/2016 2:13:10 PM       Radiology   Procedures Procedures (including critical care time)  Medications Ordered in ED Medications  levalbuterol (XOPENEX) nebulizer solution 0.63 mg (not administered)  ipratropium (ATROVENT) nebulizer solution 0.5 mg (not administered)     Initial Impression / Assessment and Plan / ED Course  I have reviewed the triage vital signs and the nursing notes.  Pertinent labs & imaging results that were available during my care of the patient were reviewed by me and considered in my medical decision making (see chart for details).  MDM Reviewed: previous chart, nursing note and vitals Reviewed previous: labs and ECG Interpretation: labs, ECG and x-ray   Results for orders placed or performed during the hospital encounter of 54/00/86  Basic metabolic panel  Result Value Ref Range   Sodium 141 135 - 145 mmol/L   Potassium 4.8 3.5 - 5.1 mmol/L   Chloride 106 101 - 111 mmol/L   CO2 29 22 - 32 mmol/L   Glucose, Bld 119 (H) 65 - 99 mg/dL   BUN 39 (H) 6 - 20 mg/dL   Creatinine, Ser 2.76 (H) 0.61 - 1.24 mg/dL   Calcium 9.3 8.9 - 10.3 mg/dL   GFR calc non Af Amer 21 (L) >60 mL/min   GFR calc Af Amer 24 (L) >60 mL/min   Anion gap 6 5 - 15  Brain natriuretic peptide  Result Value Ref Range   B Natriuretic Peptide 238.2 (H) 0.0 - 100.0 pg/mL  Troponin I  Result Value Ref Range   Troponin I <0.03 <0.03 ng/mL  CBC with Differential  Result Value Ref Range   WBC 7.0 4.0 - 10.5 K/uL   RBC 3.95 (L) 4.22 - 5.81 MIL/uL   Hemoglobin 13.0 13.0 - 17.0 g/dL   HCT 38.5 (L) 39.0 - 52.0 %   MCV 97.5 78.0 - 100.0 fL   MCH 32.9 26.0 - 34.0 pg   MCHC 33.8 30.0 - 36.0 g/dL   RDW 15.2 11.5 - 15.5 %   Platelets 148 (L) 150 - 400 K/uL   Neutrophils Relative % 59 %   Neutro Abs 4.1 1.7 - 7.7 K/uL   Lymphocytes Relative 25 %   Lymphs Abs 1.7 0.7 - 4.0 K/uL   Monocytes Relative 5 %   Monocytes Absolute 0.3 0.1 - 1.0 K/uL    Eosinophils Relative 10 %   Eosinophils Absolute 0.7 0.0 - 0.7 K/uL   Basophils Relative 1 %   Basophils Absolute  0.0 0.0 - 0.1 K/uL   Dg Chest 2 View Result Date: 10/22/2016 CLINICAL DATA:  Shortness of breath and cough. EXAM: CHEST  2 VIEW COMPARISON:  09/01/2016 and prior radiograph FINDINGS: The cardiomediastinal silhouette is unremarkable. There is no evidence of focal airspace disease, pulmonary edema, suspicious pulmonary nodule/mass, pleural effusion, or pneumothorax. No acute bony abnormalities are identified. IMPRESSION: No active cardiopulmonary disease. Electronically Signed   By: Margarette Canada M.D.   On: 10/22/2016 13:05    1455:  Dx and testing d/w pt and family.  Questions answered.  Verb understanding. Pt states he did not have CABG in [place] because "my veins are too weak."  Given his previous NSTEMI presentation, and known CAD, I am concerned that his wheezing is cardiac related, and I recommended admission to the hospital for further evaluation. After extensive d/w pt and his family, they state his PMD and Cards MD are in [place] and his insurance plan only covers ED visits; refuses to be admitted to the hospital. T/C to Cards Dr. Marlou Porch, case discussed, including:  HPI, pertinent PM/SHx, VS/PE, dx testing, ED course and treatment:  Agrees to give dose of lasix while in ED, no clear indication to change pt's home meds at this time (pt states he does take his Lipitor), encourage pt to take his meds and f/u with Cards MD in [place] ASAP.  Pt and family informed re: dx testing results, including new EKG changes concerning for cardiac insufficiency and that I recommend admission for further evaluation.  Pt refuses admission.  I encouraged pt to stay, continues to refuse.  Pt makes his own medical decisions.  Risks of AMA explained to pt and family, including, but not limited to:  stroke, heart attack, cardiac arrythmia ("irregular heart rate/beat"), "passing out," temporary and/or permanent  disability, death.  Pt and family verb understanding and continue to refuse admission, understanding the consequences of their decision.  I encouraged pt to follow up with his PMD or Cardiologist in the next few days and return to the ED immediately if symptoms return, worsen, he changes his mind, or for any other concerns.  Pt and family verb understanding, agreeable.      Final Clinical Impressions(s) / ED Diagnoses   Final diagnoses:  None    New Prescriptions New Prescriptions   No medications on file     Francine Graven, DO 10/25/16 1632

## 2016-10-22 NOTE — Discharge Instructions (Signed)
Take your usual prescriptions as previously directed.  Call your regular Cardiac doctor today to schedule a follow up appointment within the next 2 days.  Return to the Emergency Department immediately sooner if worsening, you change your mind about admission, or for any other concerns.

## 2016-10-22 NOTE — ED Notes (Signed)
Pt states he does not want albuterol at this time, he is concerned about feeling shakiness.

## 2016-10-26 ENCOUNTER — Encounter (HOSPITAL_COMMUNITY): Payer: Self-pay | Admitting: Emergency Medicine

## 2016-10-26 ENCOUNTER — Inpatient Hospital Stay (HOSPITAL_COMMUNITY)
Admission: EM | Admit: 2016-10-26 | Discharge: 2016-10-29 | DRG: 280 | Disposition: A | Discharge status: Home / Self Care | Payer: Medicare (Managed Care) | Attending: Interventional Cardiology | Admitting: Interventional Cardiology

## 2016-10-26 ENCOUNTER — Emergency Department (HOSPITAL_COMMUNITY): Payer: Medicare (Managed Care)

## 2016-10-26 DIAGNOSIS — I2510 Atherosclerotic heart disease of native coronary artery without angina pectoris: Secondary | ICD-10-CM | POA: Diagnosis present

## 2016-10-26 DIAGNOSIS — D631 Anemia in chronic kidney disease: Secondary | ICD-10-CM | POA: Diagnosis present

## 2016-10-26 DIAGNOSIS — Z7951 Long term (current) use of inhaled steroids: Secondary | ICD-10-CM

## 2016-10-26 DIAGNOSIS — E1122 Type 2 diabetes mellitus with diabetic chronic kidney disease: Secondary | ICD-10-CM | POA: Diagnosis present

## 2016-10-26 DIAGNOSIS — Z7982 Long term (current) use of aspirin: Secondary | ICD-10-CM | POA: Diagnosis not present

## 2016-10-26 DIAGNOSIS — Z882 Allergy status to sulfonamides status: Secondary | ICD-10-CM | POA: Diagnosis not present

## 2016-10-26 DIAGNOSIS — Z794 Long term (current) use of insulin: Secondary | ICD-10-CM | POA: Diagnosis not present

## 2016-10-26 DIAGNOSIS — I10 Essential (primary) hypertension: Secondary | ICD-10-CM | POA: Diagnosis not present

## 2016-10-26 DIAGNOSIS — E1129 Type 2 diabetes mellitus with other diabetic kidney complication: Secondary | ICD-10-CM | POA: Diagnosis present

## 2016-10-26 DIAGNOSIS — I350 Nonrheumatic aortic (valve) stenosis: Secondary | ICD-10-CM | POA: Diagnosis present

## 2016-10-26 DIAGNOSIS — Z8546 Personal history of malignant neoplasm of prostate: Secondary | ICD-10-CM

## 2016-10-26 DIAGNOSIS — Z833 Family history of diabetes mellitus: Secondary | ICD-10-CM | POA: Diagnosis not present

## 2016-10-26 DIAGNOSIS — E1121 Type 2 diabetes mellitus with diabetic nephropathy: Secondary | ICD-10-CM

## 2016-10-26 DIAGNOSIS — D649 Anemia, unspecified: Secondary | ICD-10-CM | POA: Diagnosis present

## 2016-10-26 DIAGNOSIS — M109 Gout, unspecified: Secondary | ICD-10-CM | POA: Diagnosis not present

## 2016-10-26 DIAGNOSIS — N183 Chronic kidney disease, stage 3 unspecified: Secondary | ICD-10-CM | POA: Diagnosis present

## 2016-10-26 DIAGNOSIS — I214 Non-ST elevation (NSTEMI) myocardial infarction: Secondary | ICD-10-CM | POA: Diagnosis not present

## 2016-10-26 DIAGNOSIS — I130 Hypertensive heart and chronic kidney disease with heart failure and stage 1 through stage 4 chronic kidney disease, or unspecified chronic kidney disease: Secondary | ICD-10-CM | POA: Diagnosis not present

## 2016-10-26 DIAGNOSIS — I252 Old myocardial infarction: Secondary | ICD-10-CM

## 2016-10-26 DIAGNOSIS — I5043 Acute on chronic combined systolic (congestive) and diastolic (congestive) heart failure: Secondary | ICD-10-CM | POA: Diagnosis present

## 2016-10-26 DIAGNOSIS — R079 Chest pain, unspecified: Secondary | ICD-10-CM | POA: Diagnosis not present

## 2016-10-26 DIAGNOSIS — Z87891 Personal history of nicotine dependence: Secondary | ICD-10-CM

## 2016-10-26 DIAGNOSIS — R0609 Other forms of dyspnea: Secondary | ICD-10-CM | POA: Diagnosis not present

## 2016-10-26 DIAGNOSIS — N184 Chronic kidney disease, stage 4 (severe): Secondary | ICD-10-CM | POA: Diagnosis present

## 2016-10-26 HISTORY — DX: Personal history of urinary calculi: Z87.442

## 2016-10-26 LAB — BRAIN NATRIURETIC PEPTIDE: B NATRIURETIC PEPTIDE 5: 225.9 pg/mL — AB (ref 0.0–100.0)

## 2016-10-26 LAB — CBC WITH DIFFERENTIAL/PLATELET
BASOS ABS: 0 10*3/uL (ref 0.0–0.1)
BASOS PCT: 0 %
EOS ABS: 0.7 10*3/uL (ref 0.0–0.7)
Eosinophils Relative: 10 %
HEMATOCRIT: 36.9 % — AB (ref 39.0–52.0)
Hemoglobin: 12.4 g/dL — ABNORMAL LOW (ref 13.0–17.0)
Lymphocytes Relative: 29 %
Lymphs Abs: 2.1 10*3/uL (ref 0.7–4.0)
MCH: 32.7 pg (ref 26.0–34.0)
MCHC: 33.6 g/dL (ref 30.0–36.0)
MCV: 97.4 fL (ref 78.0–100.0)
MONO ABS: 0.4 10*3/uL (ref 0.1–1.0)
Monocytes Relative: 6 %
NEUTROS ABS: 4.1 10*3/uL (ref 1.7–7.7)
NEUTROS PCT: 55 %
Platelets: 142 10*3/uL — ABNORMAL LOW (ref 150–400)
RBC: 3.79 MIL/uL — ABNORMAL LOW (ref 4.22–5.81)
RDW: 15.2 % (ref 11.5–15.5)
WBC: 7.3 10*3/uL (ref 4.0–10.5)

## 2016-10-26 LAB — BASIC METABOLIC PANEL
ANION GAP: 9 (ref 5–15)
BUN: 42 mg/dL — ABNORMAL HIGH (ref 6–20)
CALCIUM: 9.2 mg/dL (ref 8.9–10.3)
CO2: 26 mmol/L (ref 22–32)
CREATININE: 2.34 mg/dL — AB (ref 0.61–1.24)
Chloride: 105 mmol/L (ref 101–111)
GFR calc Af Amer: 30 mL/min — ABNORMAL LOW (ref >60)
GFR, EST NON AFRICAN AMERICAN: 26 mL/min — AB (ref >60)
Glucose, Bld: 116 mg/dL — ABNORMAL HIGH (ref 65–99)
Potassium: 4.2 mmol/L (ref 3.5–5.1)
SODIUM: 140 mmol/L (ref 135–145)

## 2016-10-26 LAB — I-STAT TROPONIN, ED
TROPONIN I, POC: 0.12 ng/mL — AB (ref 0.00–0.08)
Troponin i, poc: 0.09 ng/mL (ref 0.00–0.08)

## 2016-10-26 MED ORDER — ASPIRIN EC 81 MG PO TBEC: 81.0000 mg | DELAYED_RELEASE_TABLET | Freq: Every day | ORAL | Status: DC

## 2016-10-26 MED ORDER — INSULIN ASPART 100 UNIT/ML ~~LOC~~ SOLN
0.0000 [IU] | Freq: Three times a day (TID) | SUBCUTANEOUS | Status: DC
Start: 2016-10-27 — End: 2016-10-29
  Administered 2016-10-27 (×3): 3 [IU] via SUBCUTANEOUS
  Administered 2016-10-28: 07:00:00 2 [IU] via SUBCUTANEOUS
  Administered 2016-10-28: 17:00:00 3 [IU] via SUBCUTANEOUS
  Administered 2016-10-28 – 2016-10-29 (×2): 2 [IU] via SUBCUTANEOUS
  Administered 2016-10-29: 12:00:00 3 [IU] via SUBCUTANEOUS

## 2016-10-26 MED ORDER — LEVALBUTEROL HCL 0.63 MG/3ML IN NEBU: 0.6300 mg | INHALATION_SOLUTION | Freq: Four times a day (QID) | RESPIRATORY_TRACT | Status: DC | PRN

## 2016-10-26 MED ORDER — BUDESONIDE 0.25 MG/2ML IN SUSP
0.2500 mg | Freq: Two times a day (BID) | RESPIRATORY_TRACT | Status: DC
  Administered 2016-10-27 (×2): 0.25 mg via RESPIRATORY_TRACT
  Filled 2016-10-26 (×3): qty 2

## 2016-10-26 MED ORDER — ASPIRIN EC 81 MG PO TBEC
81.0000 mg | DELAYED_RELEASE_TABLET | Freq: Every day | ORAL | Status: DC
  Administered 2016-10-27 – 2016-10-29 (×3): 81 mg via ORAL
  Filled 2016-10-26 (×3): qty 1

## 2016-10-26 MED ORDER — ACETAMINOPHEN 325 MG PO TABS: 650.0000 mg | ORAL_TABLET | Freq: Four times a day (QID) | ORAL | Status: DC | PRN

## 2016-10-26 MED ORDER — ONDANSETRON HCL 4 MG PO TABS: 4.0000 mg | ORAL_TABLET | Freq: Four times a day (QID) | ORAL | Status: DC | PRN

## 2016-10-26 MED ORDER — METHYLPREDNISOLONE SODIUM SUCC 125 MG IJ SOLR
125.0000 mg | Freq: Once | INTRAMUSCULAR | Status: AC
  Administered 2016-10-26: 125 mg via INTRAVENOUS
  Filled 2016-10-26: qty 2

## 2016-10-26 MED ORDER — ONDANSETRON HCL 4 MG/2ML IJ SOLN: 4.0000 mg | Freq: Four times a day (QID) | INTRAMUSCULAR | Status: DC | PRN

## 2016-10-26 MED ORDER — ASPIRIN 81 MG PO CHEW
324.0000 mg | CHEWABLE_TABLET | Freq: Once | ORAL | Status: AC
  Administered 2016-10-26: 324 mg via ORAL
  Filled 2016-10-26: qty 4

## 2016-10-26 MED ORDER — ACETAMINOPHEN 650 MG RE SUPP: 650.0000 mg | Freq: Four times a day (QID) | RECTAL | Status: DC | PRN

## 2016-10-26 MED ORDER — HYDRALAZINE HCL 20 MG/ML IJ SOLN
10.0000 mg | Freq: Once | INTRAMUSCULAR | Status: AC
  Administered 2016-10-26: 10 mg via INTRAVENOUS
  Filled 2016-10-26: qty 0.5

## 2016-10-26 MED ORDER — NITROGLYCERIN IN D5W 200-5 MCG/ML-% IV SOLN
0.0000 ug/min | INTRAVENOUS | Status: DC
  Administered 2016-10-26: 5 ug/min via INTRAVENOUS
  Filled 2016-10-26: qty 250

## 2016-10-26 MED ORDER — ALPRAZOLAM 0.5 MG PO TABS
0.5000 mg | ORAL_TABLET | Freq: Every day | ORAL | Status: DC
  Administered 2016-10-27 – 2016-10-28 (×3): 0.5 mg via ORAL
  Filled 2016-10-26 (×3): qty 1

## 2016-10-26 MED ORDER — INSULIN GLARGINE 100 UNIT/ML ~~LOC~~ SOLN
19.0000 [IU] | Freq: Every day | SUBCUTANEOUS | Status: DC
  Administered 2016-10-27 – 2016-10-28 (×2): 19 [IU] via SUBCUTANEOUS
  Filled 2016-10-26 (×3): qty 0.19

## 2016-10-26 MED ORDER — ALBUTEROL SULFATE (2.5 MG/3ML) 0.083% IN NEBU
2.5000 mg | INHALATION_SOLUTION | Freq: Once | RESPIRATORY_TRACT | Status: AC
  Administered 2016-10-26: 2.5 mg via RESPIRATORY_TRACT
  Filled 2016-10-26: qty 3

## 2016-10-26 MED ORDER — METOPROLOL TARTRATE 25 MG PO TABS
50.0000 mg | ORAL_TABLET | Freq: Two times a day (BID) | ORAL | Status: DC
  Administered 2016-10-27 (×2): 50 mg via ORAL
  Filled 2016-10-26 (×2): qty 2

## 2016-10-26 MED ORDER — LEVALBUTEROL HCL 0.63 MG/3ML IN NEBU
0.6300 mg | INHALATION_SOLUTION | Freq: Four times a day (QID) | RESPIRATORY_TRACT | Status: DC
  Administered 2016-10-27 (×2): 0.63 mg via RESPIRATORY_TRACT
  Filled 2016-10-26 (×2): qty 3

## 2016-10-26 MED ORDER — FUROSEMIDE 10 MG/ML IJ SOLN
80.0000 mg | Freq: Once | INTRAMUSCULAR | Status: AC
  Administered 2016-10-26: 80 mg via INTRAVENOUS
  Filled 2016-10-26: qty 8

## 2016-10-26 NOTE — H&P (Signed)
History and Physical    Daniel Pacheco IZT:[id number] DOB: 02-26-1941 DOA: 10/26/2016  PCP: Patient, No Pcp Per  Patient coming from: Home.  Patent attorney used.  Chief Complaint: Elevated blood pressure.  HPI: Daniel Pacheco is a 76 y.o. 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 Patient states he has been compliant with his medications. In addition patient has been feeling short of breath on lying down. Has been having nonproductive cough for last week or so. Denies any fever or chills.   ED Course: In the ER patient also had some substernal chest pressure. Patient's blood pressure was found to be elevated. Troponin was mildly elevated and EKG was showing some inferolateral changes concerning for ischemia. Cardiologist was consulted and patient was placed on nitroglycerin infusion and heparin.  Review of Systems: As per HPI, rest all negative.   Past Medical History:  Diagnosis Date  . Cancer [facility])    prostate  . CHF (congestive heart failure) ([place])   . CKD (chronic kidney disease)   . Coronary artery disease 08/2015   multivessel disease, refused CABG  . Diabetes mellitus without complication ([place])   . Gout   . Hypertension   . NSTEMI (non-ST elevation myocardial infarction) ([place]) 08/2015   presented with wheezing    Past Surgical History:  Procedure Laterality Date  . CARDIAC CATHETERIZATION N/A 08/20/2015   Procedure: Left Heart Cath and Coronary Angiography;  Surgeon: Troy Sine, MD;  Location: [facility];  Service: Cardiovascular;  Laterality: N/A;     reports that he has quit smoking. He has never used smokeless tobacco. He reports that he does not drink alcohol or use drugs.  Allergies  Allergen Reactions  . Nsaids     Pt has kidney problems, wants to avoid all nsaids  .  Sulfa Antibiotics Rash    Family History  Problem Relation Age of Onset  . Diabetes Mellitus II Maternal Grandmother   . CAD Neg Hx     Prior to Admission medications   Medication Sig Start Date End Date Taking? Authorizing Provider  ALPRAZolam Duanne Moron) 1 MG tablet Take 0.5 mg by mouth at bedtime.   Yes [provider]  aspirin EC 81 MG tablet Take 81 mg by mouth daily.   Yes [provider]  insulin glargine (LANTUS) 100 UNIT/ML injection Inject 20-45 Units into the skin at bedtime. Inject 45 units in the morning and in the evening take anywhere from 20 to 35 units depending on blood glucose levels   Yes [provider]  isosorbide mononitrate (IMDUR) 60 MG 24 hr tablet Take 60 mg by mouth daily.   Yes [provider]  metoprolol (LOPRESSOR) 50 MG tablet Take 50 mg by mouth 2 (two) times daily.   Yes [provider]  nitroGLYCERIN (NITROSTAT) 0.4 MG SL tablet Place 1 tablet (0.4 mg total) under the tongue every 5 (five) minutes x 3 doses as needed for chest pain. 08/22/15  Yes Thurnell Lose, MD  acetaminophen (TYLENOL) 500 MG tablet Take 500 mg by mouth daily as needed for moderate pain.    [provider]  atorvastatin (LIPITOR) 80 MG tablet Take 1 tablet (80 mg total) by mouth daily at 6 PM. Patient not taking: Reported on 10/22/2016 08/22/15   Thurnell Lose, MD  benzonatate [facility])  100 MG capsule Take 1 capsule (100 mg total) by mouth 3 (three) times daily as needed for cough. Patient not taking: Reported on 10/22/2016 09/01/16   [name], [place]  colchicine 0.6 MG tablet Take 1 tablet (0.6 mg total) by mouth 2 (two) times daily. Take until gout clears Patient not taking: Reported on 09/01/2016 10/03/15   Forde Dandy, MD  dextromethorphan-guaiFENesin [facility] DM) 30-600 MG 12hr tablet Take 1 tablet by mouth 2 (two) times daily as needed for cough. Patient not taking: Reported on 10/26/2016 09/01/16   [name], PA  fluticasone [facility]) 50 MCG/ACT nasal spray Place 2 sprays into both nostrils daily. Patient not taking: Reported on 10/26/2016 09/01/16   [name], [place]  ipratropium-albuterol (DUONEB) 0.5-2.5 (3) MG/3ML SOLN Take 3 mLs by nebulization every 6 (six) hours as needed. Patient not taking: Reported on 09/01/2016 08/22/15   Thurnell Lose, MD  isosorbide mononitrate (IMDUR) 120 MG 24 hr tablet Take 1 tablet (120 mg total) by mouth daily. Patient not taking: Reported on 10/03/2015 08/22/15   Thurnell Lose, MD  pantoprazole (PROTONIX) 40 MG tablet Take 2 tablets (80 mg total) by mouth daily. Patient not taking: Reported on 09/01/2016 08/22/15   Thurnell Lose, MD    Physical Exam: Vitals:   10/26/16 2315 10/26/16 2325 10/26/16 2330 10/26/16 2335  BP: 140/80 139/86 (!) 146/80 139/81  Pulse: (!) 108 (!) 103 (!) 104 (!) 101  Resp: (!) 27 15 18 20   Temp:      TempSrc:      SpO2: 99% 98% 98% 98%  Weight:          Constitutional: Moderately built and nourished. Vitals:   10/26/16 2315 10/26/16 2325 10/26/16 2330 10/26/16 2335  BP: 140/80 139/86 (!) 146/80 139/81  Pulse: (!) 108 (!) 103 (!) 104 (!) 101  Resp: (!) 27 15 18 20   Temp:      TempSrc:      SpO2: 99% 98% 98% 98%  Weight:       Eyes: Anicteric no pallor. ENMT: No discharge from the ears eyes nose and mouth. Neck: JVD mildly elevated no mass felt. Respiratory: Bilateral expiratory wheeze or no crepitations. Cardiovascular: S1 and S2 heard no murmurs appreciated. Abdomen: Soft nontender bowel sounds present. Musculoskeletal: No edema. No joint effusion. Skin: No rash. Skin appears warm. Neurologic: Alert awake oriented to time place and person. Moves all extremities. Psychiatric: Appears normal. Normal affect.   Labs on Admission: I have personally reviewed following labs and imaging studies  CBC:  Recent Labs Lab 10/22/16 1310 10/26/16 1708  WBC 7.0 7.3  NEUTROABS 4.1 4.1  HGB 13.0 12.4*    HCT 38.5* 36.9*  MCV 97.5 97.4  PLT 148* 476*   Basic Metabolic Panel:  Recent Labs Lab 10/22/16 1310 10/26/16 1708  NA 141 140  K 4.8 4.2  CL 106 105  CO2 29 26  GLUCOSE 119* 116*  BUN 39* 42*  CREATININE 2.76* 2.34*  CALCIUM 9.3 9.2   GFR: Estimated Creatinine Clearance: 26.5 mL/min (A) (by C-G formula based on SCr of 2.34 mg/dL (H)). Liver Function Tests: No results for input(s): AST, ALT, ALKPHOS, BILITOT, PROT, ALBUMIN in the last 168 hours. No results for input(s): LIPASE, AMYLASE in the last 168 hours. No results for input(s): AMMONIA in the last 168 hours. Coagulation Profile: No results for input(s): INR, PROTIME in the last 168 hours. Cardiac Enzymes:  Recent Labs Lab 10/22/16 1310  TROPONINI <  0.03   BNP (last 3 results) No results for input(s): PROBNP in the last 8760 hours. HbA1C: No results for input(s): HGBA1C in the last 72 hours. CBG: No results for input(s): GLUCAP in the last 168 hours. Lipid Profile: No results for input(s): CHOL, HDL, LDLCALC, TRIG, CHOLHDL, LDLDIRECT in the last 72 hours. Thyroid Function Tests: No results for input(s): TSH, T4TOTAL, FREET4, T3FREE, THYROIDAB in the last 72 hours. Anemia Panel: No results for input(s): VITAMINB12, FOLATE, FERRITIN, TIBC, IRON, RETICCTPCT in the last 72 hours. Urine analysis:    Component Value Date/Time   COLORURINE YELLOW 10/03/2015 1952   APPEARANCEUR CLEAR 10/03/2015 1952   LABSPEC 1.009 10/03/2015 1952   PHURINE 6.0 10/03/2015 1952   GLUCOSEU NEGATIVE 10/03/2015 1952   HGBUR TRACE (A) 10/03/2015 [place] NEGATIVE 10/03/2015 [place] NEGATIVE 10/03/2015 1952   PROTEINUR NEGATIVE 10/03/2015 1952   NITRITE NEGATIVE 10/03/2015 1952   LEUKOCYTESUR NEGATIVE 10/03/2015 1952   Sepsis Labs: @LABRCNTIP (procalcitonin:4,lacticidven:4) )No results found for this or any previous visit (from the past 240 hour(s)).   Radiological Exams on Admission: Dg Chest 2  View  Result Date: 10/26/2016 CLINICAL DATA:  Hypertension. EXAM: CHEST  2 VIEW COMPARISON:  10/22/2016 FINDINGS: The cardiac silhouette, mediastinal and hilar contours are within normal limits and stable. There is mild tortuosity and calcification of the thoracic aorta. The lungs are clear of acute process. No pleural effusion or pneumothorax. The bony thorax is intact. IMPRESSION: No acute cardiopulmonary findings. Electronically Signed   By: Marijo Sanes M.D.   On: 10/26/2016 17:47    EKG: Independently reviewed. Normal sinus rhythm with inferolateral ST changes.  Assessment/Plan Principal Problem:   Non-ST elevation MI (NSTEMI) (HCC) Active Problems:   Type 2 diabetes mellitus with renal complication (HCC)   Essential hypertension   CKD (chronic kidney disease) stage 3, GFR 30-59 ml/min   Normochromic normocytic anemia    1. Non-ST elevation MI - cardiology since been consulted. Patient is placed on heparin and nitroglycerin infusion and aspirin. Cycle cardiac markers. Check 2-D echo and further recommendations per cardiologist. 2. Shortness of breath - on exam patient is breathing for which patient is placed on nebulizer and Pulmicort. Maybe also from possible CHF last 2-D echo done last year was showing EF of 65% with grade 2 diastolic dysfunction. Patient received 1 dose of Lasix. Follow repeat 2-D echocardiogram percent intake output. 3. Diabetes mellitus type 2 - since patient may be nothing by mouth I have decrease patient's dose of Lantus. Follow CBGs closely. 4. Chronic and disease stage III - follow metabolic panel. 5. Normocytic normochromic anemia likely from chronic kidney disease - follow CBC.  Patient will be transferred to [facility]. Patient and family agreeable to transfer. Dr. Alcario Drought will be the accepting physician.   DVT prophylaxis: Heparin. Code Status: Full code.  Family Communication: Daughter and wife at the bedside.  Disposition Plan: Home.   Consults called: Cardiology.  Admission status: Inpatient.    Rise Patience MD [facility] Pager [phone].  If 7PM-7AM, please contact night-coverage [email or web address] Password [facility]  10/26/2016, 11:45 PM

## 2016-10-26 NOTE — ED Notes (Signed)
ED Provider at bedside. EDP JAMES 

## 2016-10-26 NOTE — ED Notes (Signed)
Writer notified EDP of abnormal I stat trop result 

## 2016-10-26 NOTE — ED Triage Notes (Addendum)
Pt from home with complaints of htn. Pt takes he takes metoprolol 2 x daily. Pt states he was recently for cough and was told he has CHF which was the cause of his cough. Pt states he tried to call the cardiologist to follow up, but he states no one answered the phone. Pt states normally his medication controls his bp, but today his bp keeps increasing. Pt denies pain. Pt still has cough at time of assessment.  Pt's first language is spanish and will need the interpreter

## 2016-10-26 NOTE — ED Provider Notes (Addendum)
[facility] DEPT Provider Note   CSN: [id number] Arrival date & time: 10/26/16  1416     History   Chief Complaint Chief Complaint  Patient presents with  . Hypertension    HPI Daniel Pacheco is a 76 y.o. male. CC: Htn, shortness of breath  HPI:  76 year old male with history of Known multivessel CAD. Pt lives in [place]. He summers here with family. . Last July he presented with dyspnea. Underwent angiogram. Found to have severe multivessel disease. Recommended for bypass. He declined and wanted to return home to [place]. He states that his cardiologist, and surgeon there told him his "veins were too small and it wouldn't work".  He has had difficult to control hypertension over the last few weeks with cough and orthopnea. Denies chest pain. Has lower extremity edema. Is not on diuretics. He is compliant with his blood pressure medications.  History of bronchospasm. Does not use inhalers at home. Last smoking history 25 years ago.   Past Medical History:  Diagnosis Date  . Cancer [facility])    prostate  . CHF (congestive heart failure) ([place])   . CKD (chronic kidney disease)   . Coronary artery disease 08/2015   multivessel disease, refused CABG  . Diabetes mellitus without complication ([place])   . Gout   . Hypertension   . NSTEMI (non-ST elevation myocardial infarction) ([place]) 08/2015   presented with wheezing    Patient Active Problem List   Diagnosis Date Noted  . CAD (coronary artery disease)   . Elevated troponin   . NSTEMI (non-ST elevated myocardial infarction) ([place]) 08/16/2015  . SOB (shortness of breath) 08/15/2015  . Insulin dependent diabetes mellitus ([place]) 08/15/2015  . Essential hypertension 08/15/2015  . CKD (chronic kidney disease) 08/15/2015  . Normocytic anemia 08/15/2015  . H/O prostate cancer 08/15/2015  . Left shoulder pain 08/15/2015  . NSTEMI, initial episode of care ([place]) 08/15/2015  . Bronchospasm 08/15/2015  . Respiratory distress  08/15/2015    Past Surgical History:  Procedure Laterality Date  . CARDIAC CATHETERIZATION N/A 08/20/2015   Procedure: Left Heart Cath and Coronary Angiography;  Surgeon: Troy Sine, MD;  Location: [facility];  Service: Cardiovascular;  Laterality: N/A;       Home Medications    Prior to Admission medications   Medication Sig Start Date End Date Taking? Authorizing Provider  ALPRAZolam Duanne Moron) 1 MG tablet Take 0.5 mg by mouth at bedtime.   Yes [provider]  aspirin EC 81 MG tablet Take 81 mg by mouth daily.   Yes [provider]  insulin glargine (LANTUS) 100 UNIT/ML injection Inject 20-45 Units into the skin at bedtime. Inject 45 units in the morning and in the evening take anywhere from 20 to 35 units depending on blood glucose levels   Yes [provider]  isosorbide mononitrate (IMDUR) 60 MG 24 hr tablet Take 60 mg by mouth daily.   Yes [provider]  metoprolol (LOPRESSOR) 50 MG tablet Take 50 mg by mouth 2 (two) times daily.   Yes [provider]  nitroGLYCERIN (NITROSTAT) 0.4 MG SL tablet Place 1 tablet (0.4 mg total) under the tongue every 5 (five) minutes x 3 doses as needed for chest pain. 08/22/15  Yes Thurnell Lose, MD  acetaminophen (TYLENOL) 500 MG tablet Take 500 mg by mouth daily as needed for moderate pain.    [provider]  atorvastatin (LIPITOR) 80 MG tablet Take 1 tablet (80 mg  total) by mouth daily at 6 PM. Patient not taking: Reported on 10/22/2016 08/22/15   Thurnell Lose, MD  benzonatate (TESSALON) 100 MG capsule Take 1 capsule (100 mg total) by mouth 3 (three) times daily as needed for cough. Patient not taking: Reported on 10/22/2016 09/01/16   [name], [place]  colchicine 0.6 MG tablet Take 1 tablet (0.6 mg total) by mouth 2 (two) times daily. Take until gout clears Patient not taking: Reported on 09/01/2016 10/03/15   Forde Dandy, MD  dextromethorphan-guaiFENesin [facility] DM)  30-600 MG 12hr tablet Take 1 tablet by mouth 2 (two) times daily as needed for cough. Patient not taking: Reported on 10/26/2016 09/01/16   [name], PA  fluticasone [facility]) 50 MCG/ACT nasal spray Place 2 sprays into both nostrils daily. Patient not taking: Reported on 10/26/2016 09/01/16   [name], [place]  ipratropium-albuterol (DUONEB) 0.5-2.5 (3) MG/3ML SOLN Take 3 mLs by nebulization every 6 (six) hours as needed. Patient not taking: Reported on 09/01/2016 08/22/15   Thurnell Lose, MD  isosorbide mononitrate (IMDUR) 120 MG 24 hr tablet Take 1 tablet (120 mg total) by mouth daily. Patient not taking: Reported on 10/03/2015 08/22/15   Thurnell Lose, MD  pantoprazole (PROTONIX) 40 MG tablet Take 2 tablets (80 mg total) by mouth daily. Patient not taking: Reported on 09/01/2016 08/22/15   Thurnell Lose, MD    Family History Family History  Problem Relation Age of Onset  . Family history unknown: Yes    Social History Social History  Substance Use Topics  . Smoking status: Former Research scientist (life sciences)  . Smokeless tobacco: Never Used  . Alcohol use No     Allergies   Nsaids and Sulfa antibiotics   Review of Systems Review of Systems  Constitutional: Positive for activity change. Negative for appetite change, chills, diaphoresis, fatigue and fever.  HENT: Negative for mouth sores, sore throat and trouble swallowing.   Eyes: Negative for visual disturbance.  Respiratory: Positive for cough and shortness of breath. Negative for chest tightness and wheezing.   Cardiovascular: Positive for leg swelling. Negative for chest pain.  Gastrointestinal: Negative for abdominal distention, abdominal pain, diarrhea, nausea and vomiting.  Endocrine: Negative for polydipsia, polyphagia and polyuria.  Genitourinary: Negative for dysuria, frequency and hematuria.  Musculoskeletal: Negative for gait problem.  Skin: Negative for color change, pallor and rash.  Neurological: Negative  for dizziness, syncope, light-headedness and headaches.  Hematological: Does not bruise/bleed easily.  Psychiatric/Behavioral: Negative for behavioral problems and confusion.     Physical Exam Updated Vital Signs BP (!) 149/97   Pulse (!) 122   Temp 98.7 F (37.1 C) (Oral)   Resp 20   Wt 79.5 kg (175 lb 3 oz)   SpO2 99%   BMI 29.15 kg/m   Physical Exam  Constitutional: He is oriented to person, place, and time. He appears well-developed and well-nourished. No distress.  HENT:  Head: Normocephalic.  Eyes: Conjunctivae are normal. Pupils are equal, round, and reactive to light. No scleral icterus.  Neck: Normal range of motion. Neck supple. No thyromegaly present.  Cardiovascular: Exam reveals no gallop and no friction rub.   No murmur heard. Sinus tachycardia  Pulmonary/Chest: Effort normal. No respiratory distress. He has wheezes. He has no rales.  Abdominal: Soft. Bowel sounds are normal. He exhibits no distension. There is no tenderness. There is no rebound.  Musculoskeletal: Normal range of motion.  Neurological: He is alert and oriented to person, place, and  time.  Skin: Skin is warm and dry. No rash noted.  Psychiatric: He has a normal mood and affect. His behavior is normal.     ED Treatments / Results  Labs (all labs ordered are listed, but only abnormal results are displayed) Labs Reviewed  CBC WITH DIFFERENTIAL/PLATELET - Abnormal; Notable for the following:       Result Value   RBC 3.79 (*)    Hemoglobin 12.4 (*)    HCT 36.9 (*)    Platelets 142 (*)    All other components within normal limits  BASIC METABOLIC PANEL - Abnormal; Notable for the following:    Glucose, Bld 116 (*)    BUN 42 (*)    Creatinine, Ser 2.34 (*)    GFR calc non Af Amer 26 (*)    GFR calc Af Amer 30 (*)    All other components within normal limits  BRAIN NATRIURETIC PEPTIDE - Abnormal; Notable for the following:    B Natriuretic Peptide 225.9 (*)    All other components within  normal limits  I-STAT TROPOININ, ED - Abnormal; Notable for the following:    Troponin i, poc 0.12 (*)    All other components within normal limits  I-STAT TROPOININ, ED - Abnormal; Notable for the following:    Troponin i, poc 0.09 (*)    All other components within normal limits    EKG  EKG Interpretation  Date/Time:  Monday October 26 2016 16:57:25 EDT Ventricular Rate:  114 PR Interval:    QRS Duration: 91 QT Interval:  305 QTC Calculation: 420 R Axis:   45 Text Interpretation:  Sinus tachycardia Abnormal R-wave progression, early transition AnteroLateral ST depressions and TWI more prominant vs 10/22/2016 Ischemia vs LVH Confirmed by Tanna Furry [phone]) on 10/26/2016 5:02:30 PM       Radiology Dg Chest 2 View  Result Date: 10/26/2016 CLINICAL DATA:  Hypertension. EXAM: CHEST  2 VIEW COMPARISON:  10/22/2016 FINDINGS: The cardiac silhouette, mediastinal and hilar contours are within normal limits and stable. There is mild tortuosity and calcification of the thoracic aorta. The lungs are clear of acute process. No pleural effusion or pneumothorax. The bony thorax is intact. IMPRESSION: No acute cardiopulmonary findings. Electronically Signed   By: Marijo Sanes M.D.   On: 10/26/2016 17:47    Procedures Procedures (including critical care time)  Medications Ordered in ED Medications  nitroGLYCERIN 50 mg in dextrose 5 % 250 mL (0.2 mg/mL) infusion (20 mcg/min Intravenous Rate/Dose Change 10/26/16 2134)  albuterol (PROVENTIL) (2.5 MG/3ML) 0.083% nebulizer solution 2.5 mg (2.5 mg Nebulization Given 10/26/16 1845)  furosemide (LASIX) injection 80 mg (80 mg Intravenous Given 10/26/16 1842)  hydrALAZINE (APRESOLINE) injection 10 mg (10 mg Intravenous Given 10/26/16 1908)  aspirin chewable tablet 324 mg (324 mg Oral Given 10/26/16 2112)  methylPREDNISolone sodium succinate (SOLU-MEDROL) 125 mg/2 mL injection 125 mg (125 mg Intravenous Given 10/26/16 2113)     Initial Impression /  Assessment and Plan / ED Course  I have reviewed the triage vital signs and the nursing notes.  Pertinent labs & imaging results that were available during my care of the patient were reviewed by me and considered in my medical decision making (see chart for details).   EKG shows anterolateral ischemic appearing ST depressions. Minimally change versus comparison from 4 days ago. At that point he declined admission. He had normal troponins and BNP then. Troponin 0.12. BNP 225. Chest x-ray does not show cardiomegaly or failure. Given nebulized albuterol.  Given IV Lasix. Given hydralazine. He developed some chest pain he rated 1/10. Started on nitroglycerin which is being titrated for pressures. His become pain-free. Given aspirin. I discussed the case with Dr.Wierner, Cardiology fellow. Pt being seen by [facility].  CRITICAL CARE Performed by: Tanna Furry JOSEPH   Total critical care time: 60 minutes  Critical care time was exclusive of separately billable procedures and treating other patients.  Critical care was necessary to treat or prevent imminent or life-threatening deterioration.  Critical care was time spent personally by me on the following activities: development of treatment plan with patient and/or surrogate as well as nursing, discussions with consultants, evaluation of patient's response to treatment, examination of patient, obtaining history from patient or surrogate, ordering and performing treatments and interventions, ordering and review of laboratory studies, ordering and review of radiographic studies, pulse oximetry and re-evaluation of patient's condition.   Final Clinical Impressions(s) / ED Diagnoses   Final diagnoses:  NSTEMI (non-ST elevated myocardial infarction) [facility])    New Prescriptions New Prescriptions   No medications on file     Tanna Furry, MD 10/26/16 2210    Tanna Furry, MD 10/26/16 2328

## 2016-10-27 ENCOUNTER — Encounter (HOSPITAL_COMMUNITY): Payer: Self-pay | Admitting: General Practice

## 2016-10-27 DIAGNOSIS — I10 Essential (primary) hypertension: Secondary | ICD-10-CM

## 2016-10-27 DIAGNOSIS — I214 Non-ST elevation (NSTEMI) myocardial infarction: Principal | ICD-10-CM

## 2016-10-27 DIAGNOSIS — R0609 Other forms of dyspnea: Secondary | ICD-10-CM

## 2016-10-27 DIAGNOSIS — N183 Chronic kidney disease, stage 3 (moderate): Secondary | ICD-10-CM

## 2016-10-27 LAB — GLUCOSE, CAPILLARY
GLUCOSE-CAPILLARY: 238 mg/dL — AB (ref 65–99)
GLUCOSE-CAPILLARY: 247 mg/dL — AB (ref 65–99)
Glucose-Capillary: 240 mg/dL — ABNORMAL HIGH (ref 65–99)
Glucose-Capillary: 233 mg/dL — ABNORMAL HIGH (ref 65–99)

## 2016-10-27 LAB — CBG MONITORING, ED: Glucose-Capillary: 166 mg/dL — ABNORMAL HIGH (ref 65–99)

## 2016-10-27 LAB — MRSA PCR SCREENING: MRSA BY PCR: NEGATIVE

## 2016-10-27 LAB — HEPARIN LEVEL (UNFRACTIONATED): HEPARIN UNFRACTIONATED: 0.57 [IU]/mL (ref 0.30–0.70)

## 2016-10-27 LAB — TROPONIN I: TROPONIN I: 0.68 ng/mL — AB (ref <0.03)

## 2016-10-27 MED ORDER — METOPROLOL TARTRATE 25 MG PO TABS
75.0000 mg | ORAL_TABLET | Freq: Two times a day (BID) | ORAL | Status: DC
  Administered 2016-10-27 – 2016-10-29 (×4): 75 mg via ORAL
  Filled 2016-10-27 (×4): qty 3

## 2016-10-27 MED ORDER — HEPARIN (PORCINE) IN NACL 100-0.45 UNIT/ML-% IJ SOLN
900.0000 [IU]/h | INTRAMUSCULAR | Status: DC
  Administered 2016-10-27 (×2): 900 [IU]/h via INTRAVENOUS
  Filled 2016-10-27 (×2): qty 250

## 2016-10-27 MED ORDER — HEPARIN BOLUS VIA INFUSION
3500.0000 [IU] | Freq: Once | INTRAVENOUS | Status: AC
  Administered 2016-10-27: 3500 [IU] via INTRAVENOUS
  Filled 2016-10-27: qty 3500

## 2016-10-27 MED ORDER — ATORVASTATIN CALCIUM 80 MG PO TABS
80.0000 mg | ORAL_TABLET | Freq: Every day | ORAL | Status: DC
  Administered 2016-10-27 – 2016-10-28 (×2): 80 mg via ORAL
  Filled 2016-10-27 (×2): qty 1

## 2016-10-27 MED ORDER — ISOSORBIDE MONONITRATE ER 30 MG PO TB24
30.0000 mg | ORAL_TABLET | Freq: Every day | ORAL | Status: DC
  Administered 2016-10-27: 30 mg via ORAL
  Filled 2016-10-27: qty 1

## 2016-10-27 MED ORDER — AMLODIPINE BESYLATE 5 MG PO TABS
5.0000 mg | ORAL_TABLET | Freq: Every day | ORAL | Status: DC
  Administered 2016-10-27: 5 mg via ORAL
  Filled 2016-10-27: qty 1

## 2016-10-27 MED ORDER — MORPHINE SULFATE (PF) 4 MG/ML IV SOLN
2.0000 mg | Freq: Once | INTRAVENOUS | Status: AC
  Administered 2016-10-27: 03:00:00 2 mg via INTRAVENOUS
  Filled 2016-10-27: qty 1

## 2016-10-27 MED ORDER — LEVALBUTEROL HCL 0.63 MG/3ML IN NEBU
0.6300 mg | INHALATION_SOLUTION | Freq: Two times a day (BID) | RESPIRATORY_TRACT | Status: DC
  Administered 2016-10-27: 20:00:00 0.63 mg via RESPIRATORY_TRACT
  Filled 2016-10-27 (×2): qty 3

## 2016-10-27 MED ORDER — MORPHINE SULFATE (PF) 4 MG/ML IV SOLN
2.0000 mg | INTRAVENOUS | Status: DC | PRN
  Administered 2016-10-27: 2 mg via INTRAVENOUS
  Filled 2016-10-27: qty 1

## 2016-10-27 NOTE — Progress Notes (Signed)
Paged Dr. Hal Hope as per Forrest Moron orders.

## 2016-10-27 NOTE — Progress Notes (Signed)
At 0230 patient reports 10/10 chest pain; paged k kirby with [facility]. After text page requesting pain meds orders morphine 2mg  ivp x 1. Orders to page Dr. Hal Hope regarding chest pain. EKG done. Patient on NTG drip at 40 mcg and heparin at 900 units. o2 at 2L via [place].

## 2016-10-27 NOTE — Consult Note (Addendum)
Chief Complaint/Reason for Consult: Chest pain  Primary Cardiologist: In [place]  HPI: Daniel Pacheco is a 76 yo man with history of severe 3 vessel CAD, CHF, hypertension, CKD, diabetes, coming today to [facility] ED with poorly controlled blood pressure. He also reported wheezing and cough for a month. His enzymes were found to be positive, with ST changes on ECG. He started at some point complaining of chest pains, was received ASA, was started on heparin drip and nitro drip and sent to [facility] for further evaluation. After arrival we tried to go down on nitro, but he started having chest pain again. Cardiology was asked for consultation. Patient reports not feeling well for last few weeks, mostly complaining of cough, wheezing, orthopnea. He has several ED visits over this time with same complaints. He has significant cardiac history. He had NSTEMI year ago and cardiac cath showed severe 3 vessel disease. Patient was recommended CABG but never had it done. He is originally from [place] and comes here for vacation during summers. He went back and consulted local cardiologist and cardiac surgeon and was told he's not a candidtae  As he "has small veins and it won't work".   Cath in 08/2015 showed severe multivessel CAD with evidence for coronary calcification, irregular plaque of 25% in the mid left main, 80% focal stenosis in the LAD after the takeoff of a first diagonal vessel, first diagonal vessel appeared to have thrombus and was subtotally occluded after a small branch.  There was collateralization to the distal diagonal vessel; 50% proximal circumflex stenosis with 99% stenosis in the midportion of the obtuse marginal branch and 60% AV groove sooner stenosis with total occlusion of the RCA near its ostium.  There were extensive left to right collaterals supplying the RCA up to the mid vessel.   Currently patient is pain free.   Past Medical History:  Diagnosis Date  . Cancer  [facility])    prostate  . CHF (congestive heart failure) ([place])   . CKD (chronic kidney disease)   . Coronary artery disease 08/2015   multivessel disease, refused CABG  . Diabetes mellitus without complication ([place])   . Gout   . Hypertension   . NSTEMI (non-ST elevation myocardial infarction) ([place]) 08/2015   presented with wheezing    Past Surgical History:  Procedure Laterality Date  . CARDIAC CATHETERIZATION N/A 08/20/2015   Procedure: Left Heart Cath and Coronary Angiography;  Surgeon: Troy Sine, MD;  Location: [facility];  Service: Cardiovascular;  Laterality: N/A;    Family History  Problem Relation Age of Onset  . Diabetes Mellitus II Maternal Grandmother   . CAD Neg Hx    Social History:  reports that he has quit smoking. He has never used smokeless tobacco. He reports that he does not drink alcohol or use drugs.  Allergies:  Allergies  Allergen Reactions  . Nsaids     Pt has kidney problems, wants to avoid all nsaids  . Sulfa Antibiotics Rash    No current facility-administered medications on file prior to encounter.    Current Outpatient Prescriptions on File Prior to Encounter  Medication Sig Dispense Refill  . ALPRAZolam (XANAX) 1 MG tablet Take 0.5 mg by mouth at bedtime.    Marland Kitchen aspirin EC 81 MG tablet Take 81 mg by mouth daily.    . insulin glargine (LANTUS) 100 UNIT/ML injection Inject 20-45 Units into the skin at bedtime. Inject 45 units in the  morning and in the evening take anywhere from 20 to 35 units depending on blood glucose levels    . isosorbide mononitrate (IMDUR) 60 MG 24 hr tablet Take 60 mg by mouth daily.    . metoprolol (LOPRESSOR) 50 MG tablet Take 50 mg by mouth 2 (two) times daily.    . nitroGLYCERIN (NITROSTAT) 0.4 MG SL tablet Place 1 tablet (0.4 mg total) under the tongue every 5 (five) minutes x 3 doses as needed for chest pain. 30 tablet 0  . acetaminophen (TYLENOL) 500 MG tablet Take 500 mg by mouth daily as needed for moderate  pain.    Marland Kitchen atorvastatin (LIPITOR) 80 MG tablet Take 1 tablet (80 mg total) by mouth daily at 6 PM. (Patient not taking: Reported on 10/22/2016) 30 tablet 0  . benzonatate (TESSALON) 100 MG capsule Take 1 capsule (100 mg total) by mouth 3 (three) times daily as needed for cough. (Patient not taking: Reported on 10/22/2016) 21 capsule 0  . colchicine 0.6 MG tablet Take 1 tablet (0.6 mg total) by mouth 2 (two) times daily. Take until gout clears (Patient not taking: Reported on 09/01/2016) 30 tablet 0  . dextromethorphan-guaiFENesin (MUCINEX DM) 30-600 MG 12hr tablet Take 1 tablet by mouth 2 (two) times daily as needed for cough. (Patient not taking: Reported on 10/26/2016) 12 tablet 0  . fluticasone (FLONASE) 50 MCG/ACT nasal spray Place 2 sprays into both nostrils daily. (Patient not taking: Reported on 10/26/2016) 1 g 0  . ipratropium-albuterol (DUONEB) 0.5-2.5 (3) MG/3ML SOLN Take 3 mLs by nebulization every 6 (six) hours as needed. (Patient not taking: Reported on 09/01/2016) 360 mL 0  . isosorbide mononitrate (IMDUR) 120 MG 24 hr tablet Take 1 tablet (120 mg total) by mouth daily. (Patient not taking: Reported on 10/03/2015) 30 tablet 0  . pantoprazole (PROTONIX) 40 MG tablet Take 2 tablets (80 mg total) by mouth daily. (Patient not taking: Reported on 09/01/2016) 30 tablet 0    Results for orders placed or performed during the hospital encounter of 10/26/16 (from the past 48 hour(s))  CBC with Differential/Platelet     Status: Abnormal   Collection Time: 10/26/16  5:08 PM  Result Value Ref Range   WBC 7.3 4.0 - 10.5 K/uL   RBC 3.79 (L) 4.22 - 5.81 MIL/uL   Hemoglobin 12.4 (L) 13.0 - 17.0 g/dL   HCT 36.9 (L) 39.0 - 52.0 %   MCV 97.4 78.0 - 100.0 fL   MCH 32.7 26.0 - 34.0 pg   MCHC 33.6 30.0 - 36.0 g/dL   RDW 15.2 11.5 - 15.5 %   Platelets 142 (L) 150 - 400 K/uL   Neutrophils Relative % 55 %   Neutro Abs 4.1 1.7 - 7.7 K/uL   Lymphocytes Relative 29 %   Lymphs Abs 2.1 0.7 - 4.0 K/uL   Monocytes  Relative 6 %   Monocytes Absolute 0.4 0.1 - 1.0 K/uL   Eosinophils Relative 10 %   Eosinophils Absolute 0.7 0.0 - 0.7 K/uL   Basophils Relative 0 %   Basophils Absolute 0.0 0.0 - 0.1 K/uL  Basic metabolic panel     Status: Abnormal   Collection Time: 10/26/16  5:08 PM  Result Value Ref Range   Sodium 140 135 - 145 mmol/L   Potassium 4.2 3.5 - 5.1 mmol/L   Chloride 105 101 - 111 mmol/L   CO2 26 22 - 32 mmol/L   Glucose, Bld 116 (H) 65 - 99 mg/dL   Pacheco 42 (  H) 6 - 20 mg/dL   Creatinine, Ser 2.34 (H) 0.61 - 1.24 mg/dL   Calcium 9.2 8.9 - 10.3 mg/dL   GFR calc non Af Amer 26 (L) >60 mL/min   GFR calc Af Amer 30 (L) >60 mL/min    Comment: (NOTE) The eGFR has been calculated using the CKD EPI equation. This calculation has not been validated in all clinical situations. eGFR's persistently <60 mL/min signify possible Chronic Kidney Disease.    Anion gap 9 5 - 15  Brain natriuretic peptide     Status: Abnormal   Collection Time: 10/26/16  5:08 PM  Result Value Ref Range   B Natriuretic Peptide 225.9 (H) 0.0 - 100.0 pg/mL  I-stat troponin, ED     Status: Abnormal   Collection Time: 10/26/16  5:18 PM  Result Value Ref Range   Troponin i, poc 0.12 (HH) 0.00 - 0.08 ng/mL   Comment NOTIFIED PHYSICIAN    Comment 3            Comment: Due to the release kinetics of cTnI, a negative result within the first hours of the onset of symptoms does not rule out myocardial infarction with certainty. If myocardial infarction is still suspected, repeat the test at appropriate intervals.   I-stat troponin, ED     Status: Abnormal   Collection Time: 10/26/16  9:20 PM  Result Value Ref Range   Troponin i, poc 0.09 (HH) 0.00 - 0.08 ng/mL   Comment NOTIFIED PHYSICIAN    Comment 3            Comment: Due to the release kinetics of cTnI, a negative result within the first hours of the onset of symptoms does not rule out myocardial infarction with certainty. If myocardial infarction is still  suspected, repeat the test at appropriate intervals.   Troponin I (q 6hr x 3)     Status: Abnormal   Collection Time: 10/27/16 12:30 AM  Result Value Ref Range   Troponin I 0.68 (HH) <0.03 ng/mL    Comment: CRITICAL RESULT CALLED TO, READ BACK BY AND VERIFIED WITH: OAKES,L RN 6.26.18 @0111  ZANDO,C   CBG monitoring, ED     Status: Abnormal   Collection Time: 10/27/16 12:35 AM  Result Value Ref Range   Glucose-Capillary 166 (H) 65 - 99 mg/dL   Comment 1 Notify RN    Comment 2 Document in Chart    Dg Chest 2 View  Result Date: 10/26/2016 CLINICAL DATA:  Hypertension. EXAM: CHEST  2 VIEW COMPARISON:  10/22/2016 FINDINGS: The cardiac silhouette, mediastinal and hilar contours are within normal limits and stable. There is mild tortuosity and calcification of the thoracic aorta. The lungs are clear of acute process. No pleural effusion or pneumothorax. The bony thorax is intact. IMPRESSION: No acute cardiopulmonary findings. Electronically Signed   By: Marijo Sanes M.D.   On: 10/26/2016 17:47    ECG/TELE: Sinus tachycardia, ST depressions in inferolateral leads   ROS: As above. Otherwise, review of systems is negative unless per above HPI  Vitals:   10/27/16 0125 10/27/16 0130 10/27/16 0131 10/27/16 0135  BP: 123/67 122/72  104/71  Pulse: 98 (!) 104  98  Resp: 15 18  18   Temp:      TempSrc:      SpO2: 96%  98% 100%  Weight:       Wt Readings from Last 10 Encounters:  10/26/16 79.5 kg (175 lb 3 oz)  10/22/16 76.7 kg (169 lb)  09/01/16  77.1 kg (170 lb)  08/22/15 77 kg (169 lb 11.4 oz)   PE:  General: Currently in no distress HEENT: Atraumatic, EOMI, mucous membranes moist CV: RRR no murmurs, gallops. No JVD. No HJR. Respiratory: Crackles at bases, poor respiratory effort.  ABD: Non-distended and non-tender. No palpable organomegaly.  Extremities: 2+ radial pulses bilaterally. Trace edema. Neuro/Psych: CN grossly intact, alert and oriented  Assessment/Plan 1. NSTEMI -  Patient with chest pains today, controlled now on nitro drip, currently pain free. Last troponin 0.68. ECG with mild inferolateral ST depressions, slightly better on most recent ECG. Patient with known severe 3 vessel CAD. After last cath recommended  CABG, but never done. He is from [place] and doesn't have insurance here.  - Cycle enzymes, TSH, lipid panel - Echocardiogram - Received ASA 357m, continue ASA 81 mg daily - Continue home beta blocker and statin - Nitro drip - Heparin drip - CT surgery consult, social worker visit to discuss the options, discussion with family  2. CAD - As above  3. Dyspnea - Likely related to severe CAD, possible component of COPD and mild heart failure exacerbation. Can't rule out PE. BNP mildly elevated, no signs of gross fluid overload on physical exam. CXR negative for edema.  - D-dimer - Echocardiogram - Lasix 40 mg PO bid - Breathing treatments   4. HTN - At home on metoprolol only. Currently well controlled on nitro drip. He will need medications adjustment before discharge.    Lizanne Erker LAdela Lank MD 10/27/2016, 3:01 AM

## 2016-10-27 NOTE — Progress Notes (Addendum)
Progress Note  Patient Name: Daniel Pacheco Date of Encounter: 10/27/2016  Primary Cardiologist: [place]   Subjective   No further chest pain.   Inpatient Medications    Scheduled Meds: . ALPRAZolam  0.5 mg Oral QHS  . aspirin EC  81 mg Oral Daily  . budesonide (PULMICORT) nebulizer solution  0.25 mg Nebulization BID  . insulin aspart  0-9 Units Subcutaneous TID WC  . insulin glargine  19 Units Subcutaneous QHS  . levalbuterol  0.63 mg Nebulization Q6H  . metoprolol tartrate  50 mg Oral BID   Continuous Infusions: . heparin 900 Units/hr (10/27/16 0400)  . nitroGLYCERIN 25 mcg/min (10/27/16 0618)   PRN Meds: acetaminophen **OR** acetaminophen, levalbuterol, ondansetron **OR** ondansetron (ZOFRAN) IV   Vital Signs    Vitals:   10/27/16 0400 10/27/16 0500 10/27/16 0600 10/27/16 0700  BP: 123/61 110/64 93/63 (!) 99/51  Pulse: 96 93 95 85  Resp: 13 13 12 11   Temp:    97.6 F (36.4 C)  TempSrc:    Oral  SpO2: 98% 98% 98% 100%  Weight:      Height:        Intake/Output Summary (Last 24 hours) at 10/27/16 0852 Last data filed at 10/27/16 1324  Gross per 24 hour  Intake           114.13 ml  Output             1225 ml  Net         -1110.87 ml   Filed Weights   10/26/16 1421 10/27/16 0228  Weight: 175 lb 3 oz (79.5 kg) 171 lb 15.3 oz (78 kg)    Telemetry    SR - Personally Reviewed  ECG    SR with mild ST depression in anterior leads - Personally Reviewed  Physical Exam   General: Older Hispanic  male appearing in no acute distress. Head: Normocephalic, atraumatic.  Neck: Supple without bruits, JVD. Lungs:  Resp regular and unlabored, CTA. Heart: RRR, S1, S2, no S3, S4, or murmur; no rub. Abdomen: Soft, non-tender, non-distended with normoactive bowel sounds. No hepatomegaly. No rebound/guarding. No obvious abdominal masses. Extremities: No clubbing, cyanosis, edema. Distal pedal pulses are 2+ bilaterally. Neuro: Alert and oriented X 3. Moves all  extremities spontaneously. Psych: Normal affect.  Labs    Chemistry Recent Labs Lab 10/22/16 1310 10/26/16 1708  NA 141 140  K 4.8 4.2  CL 106 105  CO2 29 26  GLUCOSE 119* 116*  BUN 39* 42*  CREATININE 2.76* 2.34*  CALCIUM 9.3 9.2  GFRNONAA 21* 26*  GFRAA 24* 30*  ANIONGAP 6 9     Hematology Recent Labs Lab 10/22/16 1310 10/26/16 1708  WBC 7.0 7.3  RBC 3.95* 3.79*  HGB 13.0 12.4*  HCT 38.5* 36.9*  MCV 97.5 97.4  MCH 32.9 32.7  MCHC 33.8 33.6  RDW 15.2 15.2  PLT 148* 142*    Cardiac Enzymes Recent Labs Lab 10/22/16 1310 10/27/16 0030  TROPONINI <0.03 0.68*    Recent Labs Lab 10/26/16 1718 10/26/16 2120  TROPIPOC 0.12* 0.09*     BNP Recent Labs Lab 10/22/16 1310 10/26/16 1708  BNP 238.2* 225.9*     DDimer No results for input(s): DDIMER in the last 168 hours.    Radiology    Dg Chest 2 View  Result Date: 10/26/2016 CLINICAL DATA:  Hypertension. EXAM: CHEST  2 VIEW COMPARISON:  10/22/2016 FINDINGS: The cardiac silhouette, mediastinal and hilar contours are within normal  limits and stable. There is mild tortuosity and calcification of the thoracic aorta. The lungs are clear of acute process. No pleural effusion or pneumothorax. The bony thorax is intact. IMPRESSION: No acute cardiopulmonary findings. Electronically Signed   By: Marijo Sanes M.D.   On: 10/26/2016 17:47    Cardiac Studies   LHC: 4/17  Conclusion    Ost 1st Diag to 1st Diag lesion, 99% stenosed.  Prox Cx lesion, 50% stenosed.  2nd Mrg lesion, 99% stenosed.  Prox LAD lesion, 80% stenosed.  Ost RCA to Mid RCA lesion, 100% stenosed.  Mid Cx lesion, 60% stenosed.  Ost LM to LM lesion, 25% stenosed.   Severe multivessel CAD with evidence for coronary calcification.  There is irregular plaque of 25% in the mid left main, 80% focal stenosis in the LAD after the takeoff of a first diagonal vessel.  The first diagonal vessel appeared to have thrombus and was subtotally  occluded after a small branch.  There was collateralization to the distal diagonal vessel; 50% proximal circumflex stenosis with 99% stenosis in the midportion of the obtuse marginal branch and 60% AV groove sooner stenosis with total occlusion of the RCA near its ostium.  There are extensive left to right collaterals supplying the RCA up to the mid vessel.  RECOMMENDATION: The patient will not be transported back to [facility].  Surgical consultation will be obtained for CABG revascularization surgery.   TTE: 4/17  Study Conclusions  - Left ventricle: The cavity size was normal. Wall thickness was   normal. Systolic function was normal. The estimated ejection   fraction was in the range of 60% to 65%. Wall motion was normal;   there were no regional wall motion abnormalities. Features are   consistent with a pseudonormal left ventricular filling pattern,   with concomitant abnormal relaxation and increased filling   pressure (grade 2 diastolic dysfunction). - Aortic valve: Moderately calcified annulus. There was mild   stenosis. Valve area (VTI): 1.35 cm^2. Valve area (Vmax): 1.25   cm^2. Valve area (Vmean): 1.23 cm^2.  Patient Profile     76 y.o. male with history of triple-vessel CAD per cardiac cath last year who is originally from [place] and is on vacation over here presents to the ER with complaints of elevated blood pressures and chest pain.   Assessment & Plan    1. CAD/NSTEMI: Underwent cath last year and referred to surgery, but patient wanted to return home to [place] for possible CABG. States he did go back and saw his cardiologist there, but was told that his "vessels were not good" and did not have CABG. Developed worsening dyspnea and chest pressure over the past week and presented to the ED. Trop 0.68 on admission.  -- place on IV heparin and nitro, no further chest pain -- briefly discussed the idea of surgery again with the patient, but he is  hesitant and concerned he will not have coverage here.  -- continue with medical therapy for now. Consider consult to CVTS this admission to discuss options.   2. HTN: Controlled with current therapy  3. Dyspnea: Improved this morning. Does not appear significantly overloaded on exam.  -- given 80mg  IV lasix in the ED, 1.1L UOP thus far, weight stable. Will hold on additional lasix given elevated Cr.  -- check echo  4. CKD: Baseline appears around 2. Follow BMET -- no ACEi/ARB, hold further lasix at this time  5. HL: Resume home statin  Signed,  Reino Bellis, NP  10/27/2016, 8:52 AM    Patient seen, examined. Available data reviewed. Agree with findings, assessment, and plan as outlined by Reino Bellis, NP. Exam reveals an alert oriented male in NAD, lungs with diffuse end expiratory wheezing, heart RRR with 2/6 systolic murmur at the RUSB, no diastolic murmur. No peripheral edema. Cardiac cath films from 2017 reviewed. Pt with severe 3 vessel/distal vessel/diabetic CAD with heavy diffuse calcification. Has been told by his cardiologist in [place] that he shouldn't have CABG because of poor targets and advanced kidney disease. I agree that medical therapy would be best if he can be stabilized off of IV NTG and heparin. Will escalate his anti-anginal Rx, continue heparin today, and re-evaluate tomorrow. Wean off of IV NTG today as tolerated. Increase beta-blocker. Add imdur. Plan discussed at length. He would like to get back to [place] next week if possible. Recheck echo in setting aortic stenosis and acute on chronic diastolic CHF.  Sherren Mocha, M.D. 10/27/2016 11:13 AM

## 2016-10-27 NOTE — Progress Notes (Signed)
ANTICOAGULATION CONSULT NOTE - Initial Consult  Pharmacy Consult for Heparin Indication: chest pain/ACS  Allergies  Allergen Reactions  . Nsaids     Pt has kidney problems, wants to avoid all nsaids  . Sulfa Antibiotics Rash    Patient Measurements: Weight: 175 lb 3 oz (79.5 kg) Heparin Dosing Weight: 77kg  Vital Signs: Temp: 98.7 F (37.1 C) (06/25 1421) Temp Source: Oral (06/25 1421) BP: 111/74 (06/26 0000) Pulse Rate: 104 (06/26 0000)  Labs:  Recent Labs  10/26/16 1708  HGB 12.4*  HCT 36.9*  PLT 142*  CREATININE 2.34*    Estimated Creatinine Clearance: 26.5 mL/min (A) (by C-G formula based on SCr of 2.34 mg/dL (H)).   Medical History: Past Medical History:  Diagnosis Date  . Cancer [facility])    prostate  . CHF (congestive heart failure) ([place])   . CKD (chronic kidney disease)   . Coronary artery disease 08/2015   multivessel disease, refused CABG  . Diabetes mellitus without complication ([place])   . Gout   . Hypertension   . NSTEMI (non-ST elevation myocardial infarction) ([place]) 08/2015   presented with wheezing    Medications:  Infusions:  . nitroGLYCERIN 45 mcg/min (10/27/16 0000)    Assessment: 76 yo M with hx CAD and CKD presents with dyspnea.  Ischemia per EKG.  Troponin trending down.  Pharmacy consulted for IV heparin for NSTEMI.   CBC reviewed- Hg/Pltc slightly below normal limit, but appears to be patient's baseline.  No active bleeding noted.  Renal function also at patient's baseline.  CrCl ~60ml/min.    Goal of Therapy:  Heparin level 0.3-0.7 units/ml Monitor platelets by anticoagulation protocol: Yes   Plan:  Give 3500 units bolus x 1 Start heparin infusion at 900 units/hr Check anti-Xa level in 8 hours and daily while on heparin Continue to monitor H&H and platelets  Biagio Borg 10/27/2016,12:02 AM

## 2016-10-27 NOTE — Progress Notes (Signed)
[place] for Heparin Indication: chest pain/ACS  Allergies  Allergen Reactions  . Nsaids Other (See Comments)    Pt has kidney problems, wants to avoid all nsaids  . Sulfa Antibiotics Rash    Patient Measurements: Height: 5\' 5"  (165.1 cm) Weight: 171 lb 15.3 oz (78 kg) IBW/kg (Calculated) : 61.5 Heparin Dosing Weight: 77kg  Vital Signs: Temp: 97.6 F (36.4 C) (06/26 0700) Temp Source: Oral (06/26 0700) BP: 99/51 (06/26 0700) Pulse Rate: 85 (06/26 0700)  Labs:  Recent Labs  10/26/16 1708 10/27/16 0030 10/27/16 0658  HGB 12.4*  --   --   HCT 36.9*  --   --   PLT 142*  --   --   HEPARINUNFRC  --   --  0.57  CREATININE 2.34*  --   --   TROPONINI  --  0.68*  --     Estimated Creatinine Clearance: 26.3 mL/min (A) (by C-G formula based on SCr of 2.34 mg/dL (H)).   Medical History: Past Medical History:  Diagnosis Date  . Cancer [facility])    prostate  . CHF (congestive heart failure) ([place])   . CKD (chronic kidney disease)   . Coronary artery disease 08/2015   multivessel disease, refused CABG  . Diabetes mellitus without complication ([place])   . Gout   . Hypertension   . NSTEMI (non-ST elevation myocardial infarction) ([place]) 08/2015   presented with wheezing    Assessment: 76 yo M with hx CAD and CKD presents with dyspnea.  Ischemia per EKG.  Continues on IV heparin  Heparin level therapeutic, CBC stable  Goal of Therapy:  Heparin level 0.3-0.7 units/ml Monitor platelets by anticoagulation protocol: Yes   Plan:  Continue heparin at 900 units / hr Follow up AM labs  Thank you Anette Guarneri, PharmD [phone]  Tad Moore 10/27/2016,10:19 AM

## 2016-10-28 LAB — GLUCOSE, CAPILLARY
GLUCOSE-CAPILLARY: 227 mg/dL — AB (ref 65–99)
GLUCOSE-CAPILLARY: 227 mg/dL — AB (ref 65–99)
Glucose-Capillary: 173 mg/dL — ABNORMAL HIGH (ref 65–99)
Glucose-Capillary: 181 mg/dL — ABNORMAL HIGH (ref 65–99)

## 2016-10-28 LAB — CBC
HEMATOCRIT: 38 % — AB (ref 39.0–52.0)
Hemoglobin: 12.3 g/dL — ABNORMAL LOW (ref 13.0–17.0)
MCH: 31.9 pg (ref 26.0–34.0)
MCHC: 32.4 g/dL (ref 30.0–36.0)
MCV: 98.7 fL (ref 78.0–100.0)
PLATELETS: 167 10*3/uL (ref 150–400)
RBC: 3.85 MIL/uL — ABNORMAL LOW (ref 4.22–5.81)
RDW: 15.7 % — AB (ref 11.5–15.5)
WBC: 17.9 10*3/uL — AB (ref 4.0–10.5)

## 2016-10-28 LAB — BASIC METABOLIC PANEL
ANION GAP: 7 (ref 5–15)
BUN: 58 mg/dL — ABNORMAL HIGH (ref 6–20)
CHLORIDE: 102 mmol/L (ref 101–111)
CO2: 27 mmol/L (ref 22–32)
Calcium: 9.3 mg/dL (ref 8.9–10.3)
Creatinine, Ser: 2.62 mg/dL — ABNORMAL HIGH (ref 0.61–1.24)
GFR calc non Af Amer: 22 mL/min — ABNORMAL LOW (ref >60)
GFR, EST AFRICAN AMERICAN: 26 mL/min — AB (ref >60)
GLUCOSE: 201 mg/dL — AB (ref 65–99)
POTASSIUM: 5.3 mmol/L — AB (ref 3.5–5.1)
Sodium: 136 mmol/L (ref 135–145)

## 2016-10-28 LAB — HEPARIN LEVEL (UNFRACTIONATED): Heparin Unfractionated: 0.54 IU/mL (ref 0.30–0.70)

## 2016-10-28 MED ORDER — AMLODIPINE BESYLATE 10 MG PO TABS
10.0000 mg | ORAL_TABLET | Freq: Every day | ORAL | Status: DC
  Administered 2016-10-28 – 2016-10-29 (×2): 10 mg via ORAL
  Filled 2016-10-28 (×2): qty 1

## 2016-10-28 MED ORDER — ISOSORBIDE MONONITRATE ER 60 MG PO TB24
60.0000 mg | ORAL_TABLET | Freq: Every day | ORAL | Status: DC
  Administered 2016-10-28 – 2016-10-29 (×2): 60 mg via ORAL
  Filled 2016-10-28 (×2): qty 1

## 2016-10-28 MED ORDER — ISOSORBIDE MONONITRATE ER 60 MG PO TB24: 60.0000 mg | ORAL_TABLET | Freq: Every day | ORAL | Status: DC

## 2016-10-28 MED ORDER — AMLODIPINE BESYLATE 10 MG PO TABS: 10.0000 mg | ORAL_TABLET | Freq: Every day | ORAL | Status: DC

## 2016-10-28 NOTE — Progress Notes (Signed)
Upon arrival to patient room to give AM breathing treatment, patient explained to RT that during last night's treatment patient began to develop tremors, increased heart rate, and anxiety.  Told patient we would hold on giving treatment.  Patient currently in no distress with sats of 100%.

## 2016-10-28 NOTE — Progress Notes (Signed)
Per report from night RN, patient had an episode 5-10 minutes post breathing treatment where he experienced tremors, tachycardia, and anxiety.  Unknown which of his meds were involved.  Respiratory not contacted.   RN stated that he would be refusing breathing treatments in the future.

## 2016-10-28 NOTE — Discharge Summary (Signed)
Discharge Summary    Patient ID: Daniel Pacheco,  MRN: [id number], DOB/AGE: [birth year] 76 y.o.  Admit date: 10/26/2016 Discharge date: 10/29/2016  Primary Care Provider: Patient, No Pcp Per Primary Cardiologist: [place]  Discharge Diagnoses    Principal Problem:   Non-ST elevation MI (NSTEMI) ([place]) Active Problems:   Type 2 diabetes mellitus with renal complication (HCC)   Essential hypertension   CKD (chronic kidney disease) stage 3, GFR 30-59 ml/min   Normochromic normocytic anemia   Allergies Allergies  Allergen Reactions  . Nsaids Other (See Comments)    Pt has kidney problems, wants to avoid all nsaids  . Sulfa Antibiotics Rash    Diagnostic Studies/Procedures    TTE: 10/29/16  Study Conclusions  - Left ventricle: LVEF is apprxoimately 45% with hypokinesi of the   mid/distal anteriolateral wall, mid/distal inferolateral wall,   distal anteroseptal wall, distal anterior wall The cavity size   was mildly dilated. Wall thickness was increased in a pattern of   mild LVH. Doppler parameters are consistent with abnormal left   ventricular relaxation (grade 1 diastolic dysfunction). - Aortic valve: AV is thickened, calcified with restricted motion   Peak and mean gradients though the valve are 26 and 18 mm Hg   respectively consistent with mild AS.   2D images sugg it is more moderate There was mild regurgitation.  Impressions:  - Compared to echo from 2017 wall motion changes and LV dysfunction   are new _____________   History of Present Illness      Daniel Pacheco is a 76 yo man with history of severe 3 vessel CAD, CHF, hypertension, CKD, diabetes, who presented to [facility] ED with poorly controlled blood pressure and chest pain. He also reported wheezing and cough for a month. His enzymes were found to be positive, with ST changes on ECG. He started at some point complaining of chest pains, was received ASA, was started on heparin drip and nitro  drip and sent to [facility] for further evaluation. After arrival attempted wean nitro, but he started having chest pain again. Cardiology was asked for consultation. Patient reported not feeling well for last few weeks, mostly complaining of cough, wheezing, orthopnea. He has several ED visits over this time with same complaints. He has significant cardiac history. He had NSTEMI year ago and cardiac cath showed severe 3 vessel disease. Patient was recommended CABG but never had it done. He is originally from [place] and comes here for vacation during summers. He went back and consulted local cardiologist and cardiac surgeon and was told he's not a candidate 2/2 poor targets and CKD.   Cath in 08/2015 showed severe multivessel CAD with evidence for coronary calcification, irregular plaque of 25% in the mid left main, 80% focal stenosis in the LAD after the takeoff of a first diagonal vessel, first diagonal vessel appeared to have thrombus and was subtotally occluded after a small branch. There was collateralization to the distal diagonal vessel; 50% proximal circumflex stenosis with 99% stenosis in the midportion of the obtuse marginal branch and 60% AV groove sooner stenosis with total occlusion of the RCA near its ostium. There were extensive left to right collaterals supplying the RCA up to the mid vessel.   Hospital Course     He was admitted and Troponin 0.12>>0.09>>0.68. Cr 2.7 (around baseline) (BNP 225.9, given 80mg  IV lasix x1 with good UOP and stable weight. Given his CKD and known hx of 3v CAD, it  was felt that medical therapy was reasonable moving forward, as patient wished to follow up with his primary cardiologist back home. He was weaned from IV nitro, and placed on Imdur 30mg  daily. Continued on IV heparin for 24 hours then stopped. Reported much improvement with addition of Imdur which was titrated up to 60mg  daily, and his home amlodipine dose was increase 10mg  daily. Weaned from O2.  Echo this admission showed EF of 45% with new WMA noted above and G1DD.   He was seen by Dr. Burt Knack and determined stable for discharge home. He will arrange close follow up with his primary cardiologist when he arrives back home. Medications are listed below.   General: Well developed, well nourished, male appearing in no acute distress. Head: Normocephalic, atraumatic.  Neck: Supple without bruits, JVD. Lungs:  Resp regular and unlabored, mild expiratory wheezing, few rhonchi. Heart: RRR, S1, S2, no S3, S4, 2/6 systolic murmur; no rub. Abdomen: Soft, non-tender, non-distended with normoactive bowel sounds. No hepatomegaly. No rebound/guarding. No obvious abdominal masses. Extremities: No clubbing, cyanosis, edema. Distal pedal pulses are 2+ bilaterally. Neuro: Alert and oriented X 3. Moves all extremities spontaneously. Psych: Normal affect.  _____________  Discharge Vitals Blood pressure 133/69, pulse 81, temperature 98 F (36.7 C), temperature source Oral, resp. rate 16, height 5\' 5"  (1.651 m), weight 177 lb 14.6 oz (80.7 kg), SpO2 98 %.  Filed Weights   10/27/16 0228 10/28/16 0205 10/29/16 0500  Weight: 171 lb 15.3 oz (78 kg) 171 lb 15.3 oz (78 kg) 177 lb 14.6 oz (80.7 kg)    Labs & Radiologic Studies    CBC  Recent Labs  10/26/16 1708 10/28/16 0158 10/29/16 0214  WBC 7.3 17.9* 10.2  NEUTROABS 4.1  --   --   HGB 12.4* 12.3* 11.8*  HCT 36.9* 38.0* 35.7*  MCV 97.4 98.7 97.0  PLT 142* 167 010*   Basic Metabolic Panel  Recent Labs  10/28/16 1127 10/29/16 0820  NA 136 136  K 5.3* 4.7  CL 102 101  CO2 27 30  GLUCOSE 201* 206*  BUN 58* 60*  CREATININE 2.62* 2.70*  CALCIUM 9.3 9.1   Liver Function Tests No results for input(s): AST, ALT, ALKPHOS, BILITOT, PROT, ALBUMIN in the last 72 hours. No results for input(s): LIPASE, AMYLASE in the last 72 hours. Cardiac Enzymes  Recent Labs  10/27/16 0030  TROPONINI 0.68*   BNP Invalid input(s):  POCBNP D-Dimer No results for input(s): DDIMER in the last 72 hours. Hemoglobin A1C No results for input(s): HGBA1C in the last 72 hours. Fasting Lipid Panel No results for input(s): CHOL, HDL, LDLCALC, TRIG, CHOLHDL, LDLDIRECT in the last 72 hours. Thyroid Function Tests No results for input(s): TSH, T4TOTAL, T3FREE, THYROIDAB in the last 72 hours.  Invalid input(s): FREET3 _____________  Dg Chest 2 View  Result Date: 10/26/2016 CLINICAL DATA:  Hypertension. EXAM: CHEST  2 VIEW COMPARISON:  10/22/2016 FINDINGS: The cardiac silhouette, mediastinal and hilar contours are within normal limits and stable. There is mild tortuosity and calcification of the thoracic aorta. The lungs are clear of acute process. No pleural effusion or pneumothorax. The bony thorax is intact. IMPRESSION: No acute cardiopulmonary findings. Electronically Signed   By: Marijo Sanes M.D.   On: 10/26/2016 17:47   Dg Chest 2 View  Result Date: 10/22/2016 CLINICAL DATA:  Shortness of breath and cough. EXAM: CHEST  2 VIEW COMPARISON:  09/01/2016 and prior radiograph FINDINGS: The cardiomediastinal silhouette is unremarkable. There is no evidence of  focal airspace disease, pulmonary edema, suspicious pulmonary nodule/mass, pleural effusion, or pneumothorax. No acute bony abnormalities are identified. IMPRESSION: No active cardiopulmonary disease. Electronically Signed   By: Margarette Canada M.D.   On: 10/22/2016 13:05   Disposition   Pt is being discharged home today in good condition.  Follow-up Plans & Appointments    Follow-up Information    Primary Cardiologist Follow up.   Why:  Please arrange follow up with your primary cardiologist at home in [place].          Discharge Instructions    ([place]) Call MD:  Anytime you have any of the following symptoms: 1) 3 pound weight gain in 24 hours or 5 pounds in 1 week 2) shortness of breath, with or without a dry hacking cough 3) swelling in the hands,  feet or stomach 4) if you have to sleep on extra pillows at night in order to breathe.    Complete by:  As directed    Diet - low sodium heart healthy    Complete by:  As directed    Discharge instructions    Complete by:  As directed    We added new medications this admission, amlodipine for blood pressure and lasix which is a fluid pill. Please take as directed. Keep your follow up that is arranged with your primary cardiologist once you get home. You have been given a disc with your echo to take back with you.   Increase activity slowly    Complete by:  As directed       Discharge Medications   Current Discharge Medication List    START taking these medications   Details  amLODipine (NORVASC) 10 MG tablet Take 1 tablet (10 mg total) by mouth daily. Qty: 30 tablet, Refills: 0    furosemide (LASIX) 80 MG tablet Take 1 tablet (80 mg total) by mouth daily. Qty: 30 tablet, Refills: 0      CONTINUE these medications which have CHANGED   Details  metoprolol tartrate (LOPRESSOR) 50 MG tablet Take 1.5 tablets (75 mg total) by mouth 2 (two) times daily. Qty: 60 tablet, Refills: 0      CONTINUE these medications which have NOT CHANGED   Details  ALPRAZolam (XANAX) 1 MG tablet Take 0.5 mg by mouth at bedtime.    aspirin EC 81 MG tablet Take 81 mg by mouth daily.    atorvastatin (LIPITOR) 80 MG tablet Take 1 tablet (80 mg total) by mouth daily at 6 PM. Qty: 30 tablet, Refills: 0    insulin glargine (LANTUS) 100 UNIT/ML injection Inject 20-45 Units into the skin at bedtime. Inject 45 units in the morning and in the evening take anywhere from 20 to 35 units depending on blood glucose levels    isosorbide mononitrate (IMDUR) 60 MG 24 hr tablet Take 60 mg by mouth daily.    nitroGLYCERIN (NITROSTAT) 0.4 MG SL tablet Place 1 tablet (0.4 mg total) under the tongue every 5 (five) minutes x 3 doses as needed for chest pain. Qty: 30 tablet, Refills: 0    acetaminophen (TYLENOL) 500 MG  tablet Take 500 mg by mouth daily as needed for moderate pain.      STOP taking these medications     benzonatate (TESSALON) 100 MG capsule      colchicine 0.6 MG tablet      dextromethorphan-guaiFENesin (MUCINEX DM) 30-600 MG 12hr tablet      fluticasone (FLONASE) 50 MCG/ACT nasal spray  ipratropium-albuterol (DUONEB) 0.5-2.5 (3) MG/3ML SOLN      pantoprazole (PROTONIX) 40 MG tablet          Outstanding Labs/Studies   N/a  Duration of Discharge Encounter   Greater than 30 minutes including physician time.  Signed, Reino Bellis NP-C 10/29/2016, 12:46 PM  Patient seen, examined. Available data reviewed. Agree with findings, assessment, and plan as outlined by Reino Bellis, NP-C. The patient is independently interviewed and examined. JVP is normal. Carotid upstrokes are normal. He is sitting up in a chair, alert and oriented in no distress. Heart is regular rate and rhythm with a grade 2/6 systolic murmur at the right upper sternal border. There is no diastolic murmur. Lung fields have no and Tory crackles but there is end expiratory wheezing present. Abdomen is soft and nontender. Extremities have trace edema bilaterally.  The patient's lab data is reviewed. He's had a very small troponin elevation this admission. His stage IV chronic kidney disease appears stable with a creatinine of 2.70. An echocardiogram is done this morning and I have personally reviewed the images. The formal interpretation is pending. The echo study is compared to an echo that was done here in 2017. He previously had normal LV systolic function. He now has an extensive wall motion abnormality with akinesis of the anterolateral wall. He has a heavily calcified aortic valve with moderate aortic stenosis.  I reviewed the echo findings with the patient and explained the implications of the new wall motion abnormality and reduced LV function. The patient is clinically improved with better control of his  blood pressure. His antihypertensive/anti-anginal program includes amlodipine 10 mg, isosorbide 60 mg, and metoprolol 75 mg twice a day. He is having no further angina and is now off of IV heparin for 24 hours. He really is not a good candidate for repeat angiography because of his known extensive calcified diffuse small vessel coronary disease as well as advanced kidney disease and risk for acute kidney injury/contrast nephropathy. We will manage him medically and add furosemide 80 mg daily to help prevent further pulmonary edema. He has close follow-up in [place] and is scheduled for a visit with his primary cardiologist on July 6. Will send records and copies of his echo study.  Sherren Mocha, M.D. 10/29/2016 12:46 PM

## 2016-10-28 NOTE — Progress Notes (Signed)
Progress Note  Patient Name: Daniel Pacheco Date of Encounter: 10/28/2016  Primary Cardiologist: [place]  Subjective   Feeling much better today. No further chest pain. Breathing has improved.   Inpatient Medications    Scheduled Meds: . ALPRAZolam  0.5 mg Oral QHS  . amLODipine  5 mg Oral Daily  . aspirin EC  81 mg Oral Daily  . atorvastatin  80 mg Oral q1800  . budesonide (PULMICORT) nebulizer solution  0.25 mg Nebulization BID  . insulin aspart  0-9 Units Subcutaneous TID WC  . insulin glargine  19 Units Subcutaneous QHS  . isosorbide mononitrate  30 mg Oral Daily  . levalbuterol  0.63 mg Nebulization BID  . metoprolol tartrate  75 mg Oral BID   Continuous Infusions: . heparin 900 Units/hr (10/27/16 2300)  . nitroGLYCERIN Stopped (10/27/16 1322)   PRN Meds: acetaminophen **OR** acetaminophen, levalbuterol, morphine injection, ondansetron **OR** ondansetron (ZOFRAN) IV   Vital Signs    Vitals:   10/27/16 2209 10/28/16 0205 10/28/16 0650 10/28/16 0700  BP: (!) 160/93 131/67 (!) 150/80 137/79  Pulse: (!) 102 96 87 90  Resp: 17 15 14 14   Temp:  97.6 F (36.4 C)  97.9 F (36.6 C)  TempSrc:  Oral  Oral  SpO2: 99% 97% 98% 99%  Weight:  171 lb 15.3 oz (78 kg)    Height:        Intake/Output Summary (Last 24 hours) at 10/28/16 0959 Last data filed at 10/28/16 0700  Gross per 24 hour  Intake          2112.15 ml  Output              575 ml  Net          1537.15 ml   Filed Weights   10/26/16 1421 10/27/16 0228 10/28/16 0205  Weight: 175 lb 3 oz (79.5 kg) 171 lb 15.3 oz (78 kg) 171 lb 15.3 oz (78 kg)    Telemetry    SR - Personally Reviewed  ECG    N/A - Personally Reviewed  Physical Exam   General: Older hispanic male appearing in no acute distress. Looks much better today Head: Normocephalic, atraumatic.  Neck: Supple without bruits, JVD. Lungs:  Resp regular and unlabored, Diminished in lower lobes. Heart: RRR, S1, S2, no S3, S4, soft  systolic murmur; no rub. Abdomen: Soft, non-tender, non-distended with normoactive bowel sounds. No hepatomegaly. No rebound/guarding. No obvious abdominal masses. Extremities: No clubbing, cyanosis, edema. Distal pedal pulses are 2+ bilaterally. Neuro: Alert and oriented X 3. Moves all extremities spontaneously. Psych: Normal affect.  Labs    Chemistry Recent Labs Lab 10/22/16 1310 10/26/16 1708  NA 141 140  K 4.8 4.2  CL 106 105  CO2 29 26  GLUCOSE 119* 116*  BUN 39* 42*  CREATININE 2.76* 2.34*  CALCIUM 9.3 9.2  GFRNONAA 21* 26*  GFRAA 24* 30*  ANIONGAP 6 9     Hematology Recent Labs Lab 10/22/16 1310 10/26/16 1708 10/28/16 0158  WBC 7.0 7.3 17.9*  RBC 3.95* 3.79* 3.85*  HGB 13.0 12.4* 12.3*  HCT 38.5* 36.9* 38.0*  MCV 97.5 97.4 98.7  MCH 32.9 32.7 31.9  MCHC 33.8 33.6 32.4  RDW 15.2 15.2 15.7*  PLT 148* 142* 167    Cardiac Enzymes Recent Labs Lab 10/22/16 1310 10/27/16 0030  TROPONINI <0.03 0.68*    Recent Labs Lab 10/26/16 1718 10/26/16 2120  TROPIPOC 0.12* 0.09*     BNP Recent Labs  Lab 10/22/16 1310 10/26/16 1708  BNP 238.2* 225.9*     DDimer No results for input(s): DDIMER in the last 168 hours.    Radiology    Dg Chest 2 View  Result Date: 10/26/2016 CLINICAL DATA:  Hypertension. EXAM: CHEST  2 VIEW COMPARISON:  10/22/2016 FINDINGS: The cardiac silhouette, mediastinal and hilar contours are within normal limits and stable. There is mild tortuosity and calcification of the thoracic aorta. The lungs are clear of acute process. No pleural effusion or pneumothorax. The bony thorax is intact. IMPRESSION: No acute cardiopulmonary findings. Electronically Signed   By: Marijo Sanes M.D.   On: 10/26/2016 17:47    Cardiac Studies   TTE: Pending   Patient Profile     76 y.o. male with history of triple-vessel CAD per cardiac cath last year who is originally from [place] and is on vacation over herep[place] to the ER with complaints of  elevated blood pressures and chest pain.   Assessment & Plan    1. CAD/NSTEMI: Underwent cath last year and referred to surgery, but patient wanted to return home to [place] for possible CABG. States he did go back and saw his cardiologist there, but was told that he was not a good candidate due to poor targets and kidney disease. Developed worsening dyspnea and chest pressure over the past week and presented to the ED. Trop 0.68 on admission.  -- weaned from nitro and Imdur added yesterday. Will further increase to 60mg  daily.  -- stop heparin, ambulate -- echo pending  2. HTN: Recommendations as above -- increase amlodipine to 10mg   3. Acute systolic HF: Continues to improve. Does not appear significantly overloaded on exam.  -- given 80mg  IV lasix in the ED, 1.1L UOP thus far, weight stable.  -- await echo for LV function  4. CKD: Baseline appears around 2.  -- check BMET in am -- no ACEi/ARB  5. HL: Resume home statin  Signed, Reino Bellis, NP  10/28/2016, 9:59 AM    Patient seen, examined. Available data reviewed. Agree with findings, assessment, and plan as outlined by Reino Bellis, NP-C. on my exam today the patient is alert, oriented, in no distress. Lung fields continue to have end expiratory wheezing and scattered rhonchi. JVP is normal. There are no carotid bruits. Heart is regular rate and rhythm with a 2/6 systolic ejection murmur best heard at the right upper sternal border. Abdomen is soft and nontender. Extremities show no edema. Skin is warm and dry. The patient has clinically improved with better control of his blood pressure and diuresis. Plans to avoid repeat invasive angiography as outlined in this patient who has known severe multivessel coronary disease with poor target vessels and heavy calcification in a pattern of severe diffuse diabetic coronary disease. Recommend increase amlodipine to 10 mg daily, increase isosorbide to 60 mg daily, stop heparin,  ambulate, and anticipate hospital discharge tomorrow if he is clinically stable. An echocardiogram has been ordered and is currently pending. Patient with plans to return to his home in [place] next week.  Sherren Mocha, M.D. 10/28/2016 11:30 AM

## 2016-10-28 NOTE — Progress Notes (Signed)
Daniel Pacheco for Heparin Indication: chest pain/ACS  Allergies  Allergen Reactions  . Nsaids Other (See Comments)    Pt has kidney problems, wants to avoid all nsaids  . Sulfa Antibiotics Rash    Patient Measurements: Height: 5\' 5"  (165.1 cm) Weight: 171 lb 15.3 oz (78 kg) IBW/kg (Calculated) : 61.5 Heparin Dosing Weight: 77kg  Vital Signs: Temp: 97.9 F (36.6 C) (06/27 0700) Temp Source: Oral (06/27 0700) BP: 137/79 (06/27 0700) Pulse Rate: 90 (06/27 0700)  Labs:  Recent Labs  10/26/16 1708 10/27/16 0030 10/27/16 0658 10/28/16 0158  HGB 12.4*  --   --  12.3*  HCT 36.9*  --   --  38.0*  PLT 142*  --   --  167  HEPARINUNFRC  --   --  0.57 0.54  CREATININE 2.34*  --   --   --   TROPONINI  --  0.68*  --   --     Estimated Creatinine Clearance: 26.3 mL/min (A) (by C-G formula based on SCr of 2.34 mg/dL (H)).   Medical History: Past Medical History:  Diagnosis Date  . Cancer [facility])    prostate  . CHF (congestive heart failure) ([place])   . CKD (chronic kidney disease)   . Coronary artery disease 08/2015   multivessel disease, refused CABG  . Diabetes mellitus without complication ([place])   . Gout   . History of kidney stones   . Hypertension   . NSTEMI (non-ST elevation myocardial infarction) ([place]) 08/2015   presented with wheezing    Assessment: 76 yo M with hx CAD and CKD presents with dyspnea.  Ischemia per EKG.  Continues on IV heparin  Heparin level therapeutic, CBC stable  Goal of Therapy:  Heparin level 0.3-0.7 units/ml Monitor platelets by anticoagulation protocol: Yes   Plan:  Continue heparin at 900 units / hr Follow up AM labs  Thank you Anette Guarneri, PharmD [phone]   10/28/2016,9:04 AM

## 2016-10-28 NOTE — Progress Notes (Signed)
Patient able to communicate in [place], but would probably benefit from an interpretor for DC instructions.

## 2016-10-28 NOTE — Care Management Note (Signed)
Case Management Note  Patient Details  Name: Daniel Pacheco MRN: [id number] Date of Birth: [birth year]  Subjective/Objective:   CAD/NSTEMI, HTN, HF, CKD                 Action/Plan: Discharge Planning: NCM spoke to pt and wife at bedside. Pt states he can afford his medications. He is scheduled to go back to [place] on November 04, 2016. States he eats a heart healthy diet and has physicians in [place] that he sees.    Expected Discharge Date:                 Expected Discharge Plan:  Home/Self Care  In-House Referral:  NA  Discharge planning Services  CM Consult  Post Acute Care Choice:  NA Choice offered to:  NA  DME Arranged:  N/A DME Agency:  NA  HH Arranged:  NA HH Agency:  NA  Status of Service:  Completed, signed off  If discussed at [place] of Stay Meetings, dates discussed:    Additional Comments:  Erenest Rasher, RN 10/28/2016, 11:13 AM

## 2016-10-29 ENCOUNTER — Inpatient Hospital Stay (HOSPITAL_COMMUNITY): Payer: Medicare (Managed Care)

## 2016-10-29 DIAGNOSIS — I350 Nonrheumatic aortic (valve) stenosis: Secondary | ICD-10-CM

## 2016-10-29 LAB — CBC
HCT: 35.7 % — ABNORMAL LOW (ref 39.0–52.0)
HEMOGLOBIN: 11.8 g/dL — AB (ref 13.0–17.0)
MCH: 32.1 pg (ref 26.0–34.0)
MCHC: 33.1 g/dL (ref 30.0–36.0)
MCV: 97 fL (ref 78.0–100.0)
Platelets: 147 10*3/uL — ABNORMAL LOW (ref 150–400)
RBC: 3.68 MIL/uL — AB (ref 4.22–5.81)
RDW: 15.5 % (ref 11.5–15.5)
WBC: 10.2 10*3/uL (ref 4.0–10.5)

## 2016-10-29 LAB — BASIC METABOLIC PANEL
Anion gap: 5 (ref 5–15)
BUN: 60 mg/dL — AB (ref 6–20)
CALCIUM: 9.1 mg/dL (ref 8.9–10.3)
CO2: 30 mmol/L (ref 22–32)
CREATININE: 2.7 mg/dL — AB (ref 0.61–1.24)
Chloride: 101 mmol/L (ref 101–111)
GFR calc Af Amer: 25 mL/min — ABNORMAL LOW (ref >60)
GFR, EST NON AFRICAN AMERICAN: 22 mL/min — AB (ref >60)
Glucose, Bld: 206 mg/dL — ABNORMAL HIGH (ref 65–99)
POTASSIUM: 4.7 mmol/L (ref 3.5–5.1)
SODIUM: 136 mmol/L (ref 135–145)

## 2016-10-29 LAB — GLUCOSE, CAPILLARY
GLUCOSE-CAPILLARY: 230 mg/dL — AB (ref 65–99)
Glucose-Capillary: 162 mg/dL — ABNORMAL HIGH (ref 65–99)

## 2016-10-29 LAB — ECHOCARDIOGRAM COMPLETE
HEIGHTINCHES: 65 in
Weight: 2846.58 oz

## 2016-10-29 LAB — HEPARIN LEVEL (UNFRACTIONATED): HEPARIN UNFRACTIONATED: 0.18 [IU]/mL — AB (ref 0.30–0.70)

## 2016-10-29 MED ORDER — METOPROLOL TARTRATE 50 MG PO TABS: 75.0000 mg | ORAL_TABLET | Freq: Two times a day (BID) | ORAL | 0 refills | Status: AC

## 2016-10-29 MED ORDER — AMLODIPINE BESYLATE 10 MG PO TABS: 10.0000 mg | ORAL_TABLET | Freq: Every day | ORAL | 0 refills | Status: AC

## 2016-10-29 MED ORDER — FUROSEMIDE 80 MG PO TABS: 80.0000 mg | ORAL_TABLET | Freq: Every day | ORAL | 0 refills | Status: AC

## 2016-10-29 NOTE — Progress Notes (Signed)
  Echocardiogram 2D Echocardiogram has been performed.  Daniel Pacheco L Androw 10/29/2016, 9:50 AM

## 2016-12-02 DEATH — deceased

## 2017-05-01 IMAGING — CR DG CHEST 2V
2 series · 2 of 2 positions shown · non-contrast
Comparison: 10/03/2015

CLINICAL DATA: Wheezing 1 week with productive cough and dizziness.

EXAM:
CHEST  2 VIEW

[w chest pa]
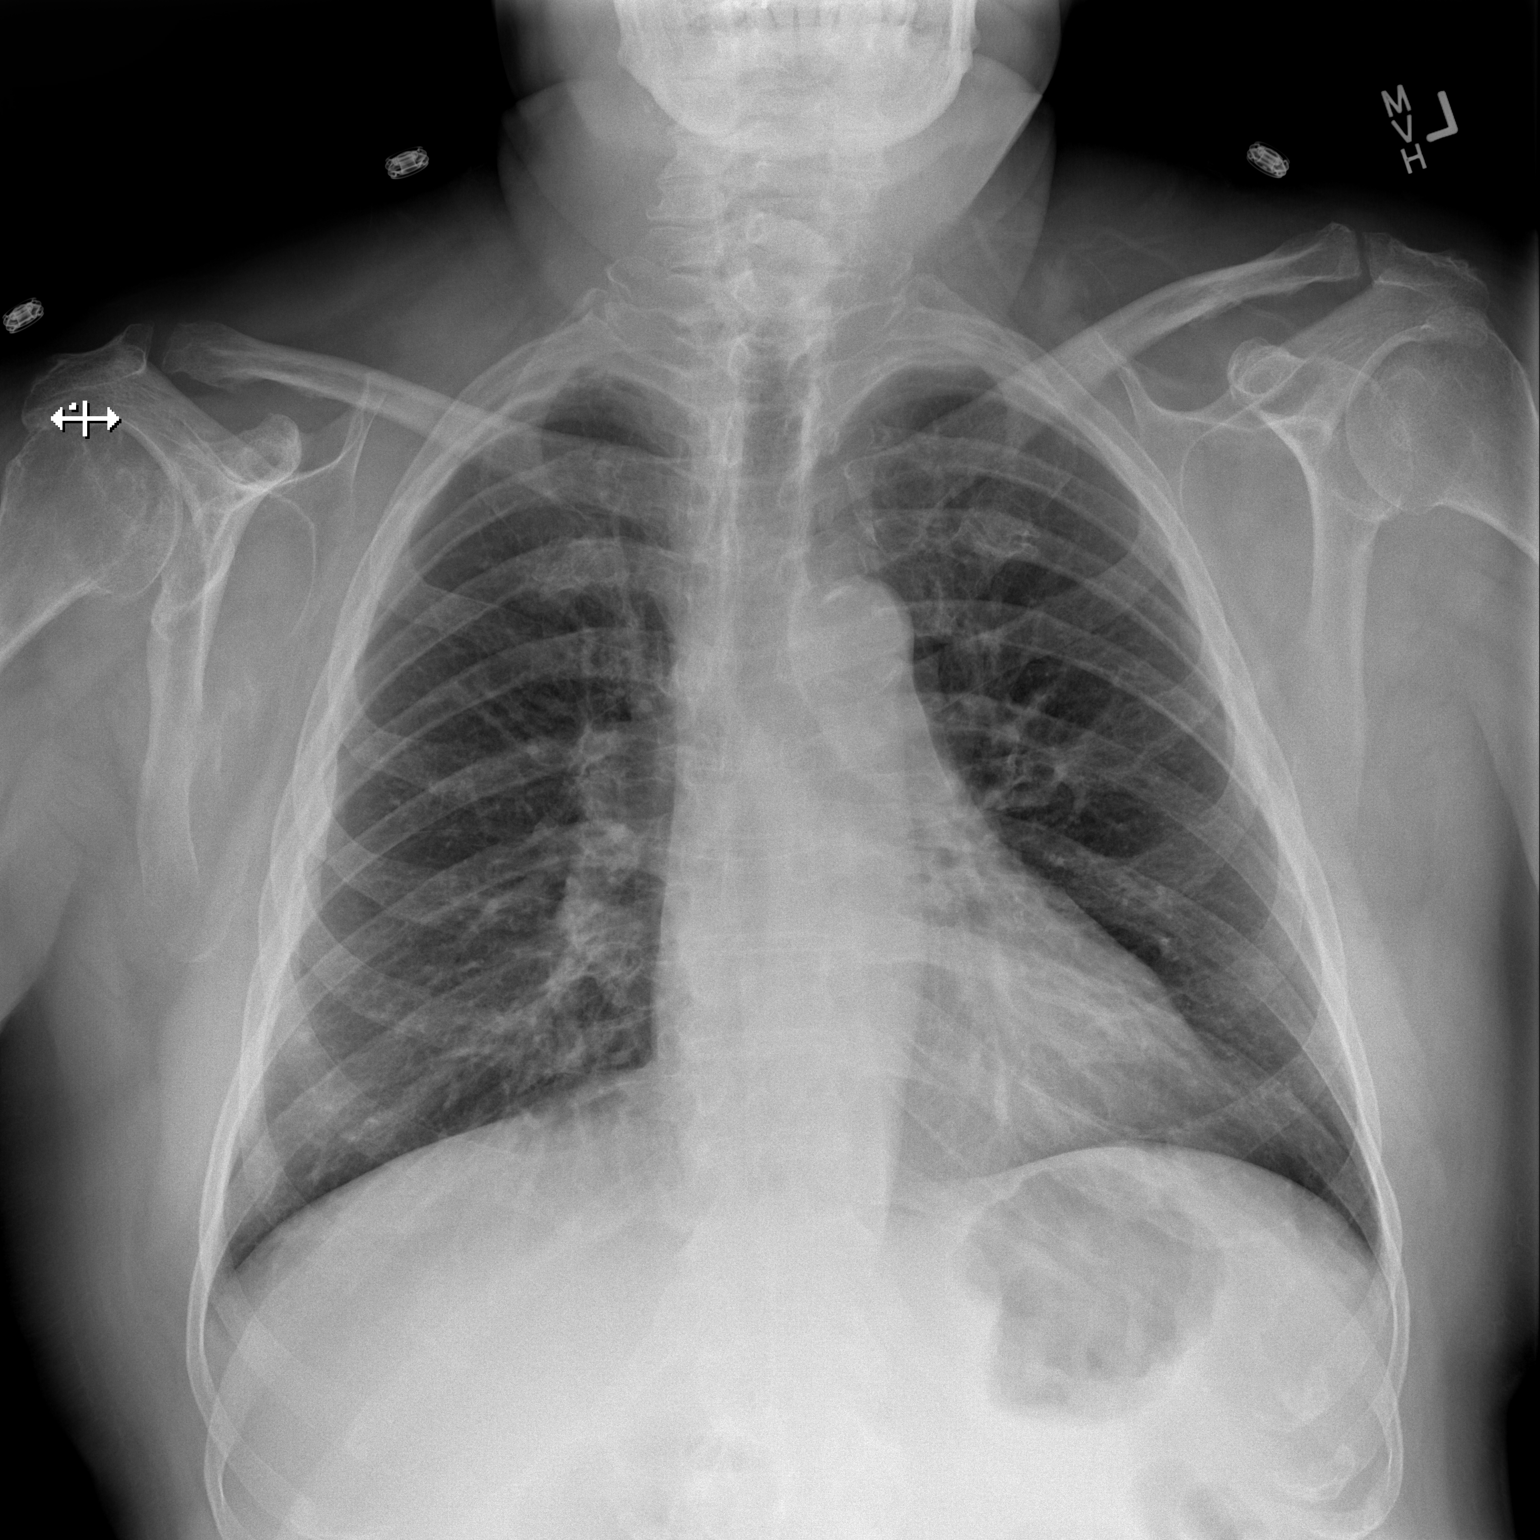

[w chest lat]
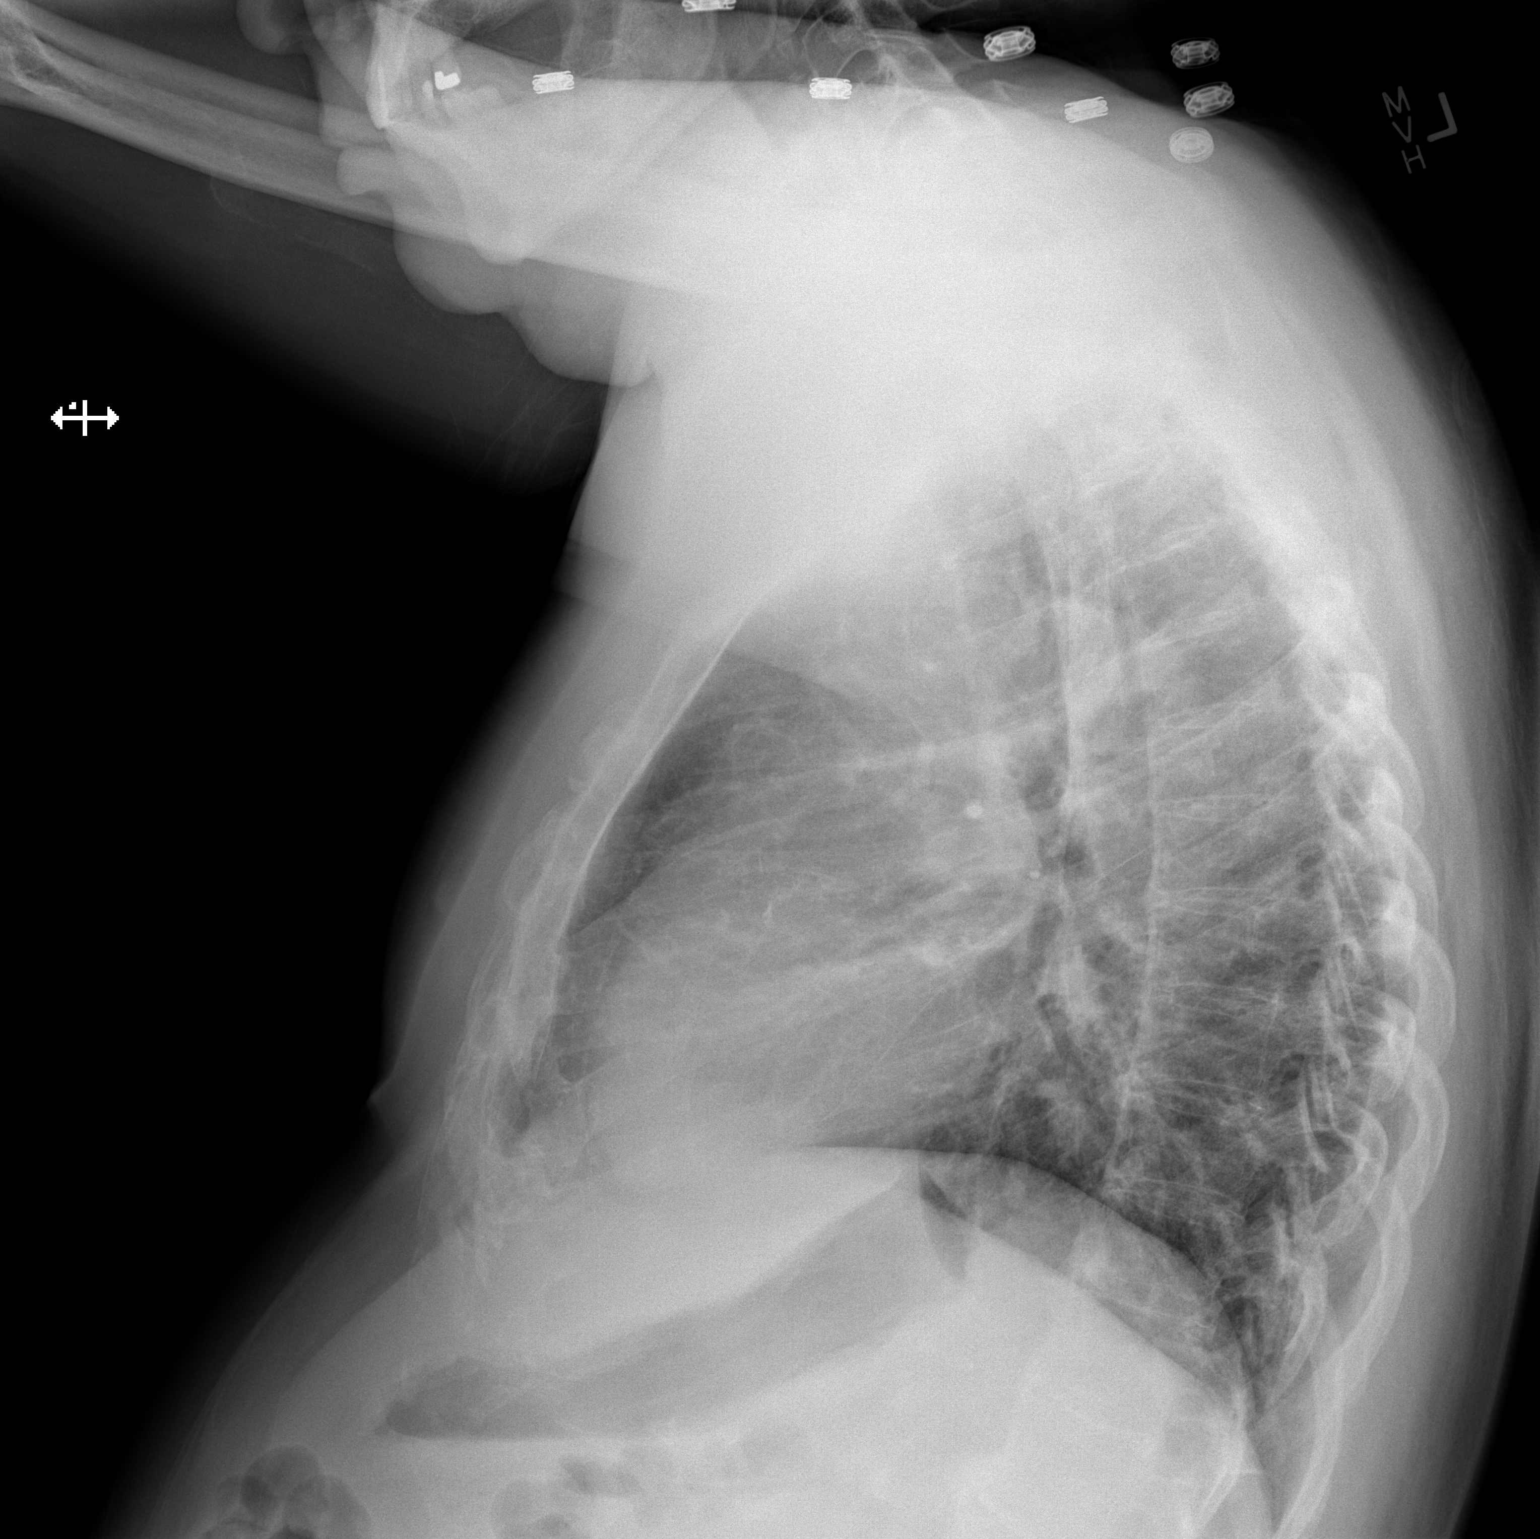

[2 of 2 positions shown; findings below may reference images not displayed]

FINDINGS: Lungs are adequately inflated without focal consolidation or
effusion. Cardiomediastinal silhouette is within normal. There is
calcified plaque over the thoracic aorta. There are mild degenerate
changes of the spine.
IMPRESSION: No acute cardiopulmonary disease.

Aortic atherosclerosis.
